# Patient Record
Sex: Female | Born: 1935 | Race: White | Hispanic: No | Smoking: Never smoker
Health system: Southern US, Community
[De-identification: ages and names within clinical notes are randomized; demographics above are authoritative.]

## PROBLEM LIST (undated history)

## (undated) DIAGNOSIS — I639 Cerebral infarction, unspecified: Secondary | ICD-10-CM

## (undated) DIAGNOSIS — E079 Disorder of thyroid, unspecified: Secondary | ICD-10-CM

## (undated) DIAGNOSIS — E785 Hyperlipidemia, unspecified: Secondary | ICD-10-CM

## (undated) HISTORY — DX: Disorder of thyroid, unspecified: E07.9

## (undated) HISTORY — PX: APPENDECTOMY: SHX54

## (undated) HISTORY — DX: Hyperlipidemia, unspecified: E78.5

## (undated) HISTORY — DX: Cerebral infarction, unspecified: I63.9

---

## 2003-10-20 ENCOUNTER — Other Ambulatory Visit: Payer: Self-pay

## 2006-09-05 ENCOUNTER — Ambulatory Visit: Payer: Self-pay | Admitting: Nurse Practitioner

## 2010-05-10 ENCOUNTER — Inpatient Hospital Stay: Payer: Self-pay | Admitting: Internal Medicine

## 2011-05-07 ENCOUNTER — Inpatient Hospital Stay: Payer: Self-pay | Admitting: Internal Medicine

## 2011-05-07 LAB — CK TOTAL AND CKMB (NOT AT ARMC)
CK, Total: 209 U/L (ref 21–215)
CK, Total: 154 U/L (ref 21–215)

## 2011-05-07 LAB — TROPONIN I: Troponin-I: 0.36 ng/mL — ABNORMAL HIGH

## 2011-05-08 LAB — BASIC METABOLIC PANEL
BUN: 17 mg/dL (ref 7–18)
Calcium, Total: 8.1 mg/dL — ABNORMAL LOW (ref 8.5–10.1)
Chloride: 110 mmol/L — ABNORMAL HIGH (ref 98–107)
Osmolality: 294 (ref 275–301)
Potassium: 4.1 mmol/L (ref 3.5–5.1)
Sodium: 147 mmol/L — ABNORMAL HIGH (ref 136–145)

## 2013-09-17 ENCOUNTER — Emergency Department: Payer: Self-pay | Admitting: Emergency Medicine

## 2013-09-17 LAB — URINALYSIS, COMPLETE
BACTERIA: NONE SEEN
Bilirubin,UR: NEGATIVE
Blood: NEGATIVE
Glucose,UR: NEGATIVE mg/dL (ref 0–75)
KETONE: NEGATIVE
LEUKOCYTE ESTERASE: NEGATIVE
Nitrite: NEGATIVE
PH: 5 (ref 4.5–8.0)
Protein: NEGATIVE
RBC,UR: NONE SEEN /HPF (ref 0–5)
SPECIFIC GRAVITY: 1.021 (ref 1.003–1.030)
Squamous Epithelial: 1
WBC UR: NONE SEEN /HPF (ref 0–5)

## 2013-09-17 LAB — COMPREHENSIVE METABOLIC PANEL
ALK PHOS: 136 U/L — AB
Albumin: 3.2 g/dL — ABNORMAL LOW (ref 3.4–5.0)
Anion Gap: 5 — ABNORMAL LOW (ref 7–16)
BUN: 18 mg/dL (ref 7–18)
Bilirubin,Total: 0.4 mg/dL (ref 0.2–1.0)
CHLORIDE: 106 mmol/L (ref 98–107)
CREATININE: 1.05 mg/dL (ref 0.60–1.30)
Calcium, Total: 8.9 mg/dL (ref 8.5–10.1)
Co2: 27 mmol/L (ref 21–32)
GFR CALC AF AMER: 57 — AB
GFR CALC NON AF AMER: 49 — AB
Glucose: 147 mg/dL — ABNORMAL HIGH (ref 65–99)
OSMOLALITY: 280 (ref 275–301)
POTASSIUM: 4.9 mmol/L (ref 3.5–5.1)
SGOT(AST): 71 U/L — ABNORMAL HIGH (ref 15–37)
SGPT (ALT): 81 U/L — ABNORMAL HIGH (ref 12–78)
Sodium: 138 mmol/L (ref 136–145)
TOTAL PROTEIN: 7.6 g/dL (ref 6.4–8.2)

## 2013-09-17 LAB — CBC
HCT: 44.2 % (ref 35.0–47.0)
HGB: 14.6 g/dL (ref 12.0–16.0)
MCH: 30.7 pg (ref 26.0–34.0)
MCHC: 33 g/dL (ref 32.0–36.0)
MCV: 93 fL (ref 80–100)
PLATELETS: 219 10*3/uL (ref 150–440)
RBC: 4.75 10*6/uL (ref 3.80–5.20)
RDW: 13.2 % (ref 11.5–14.5)
WBC: 6.4 10*3/uL (ref 3.6–11.0)

## 2013-09-17 LAB — CK-MB: CK-MB: 2.6 ng/mL (ref 0.5–3.6)

## 2013-09-17 LAB — TROPONIN I: TROPONIN-I: 0.4 ng/mL — AB

## 2013-09-19 LAB — URINE CULTURE

## 2013-11-10 ENCOUNTER — Inpatient Hospital Stay: Payer: Self-pay | Admitting: Internal Medicine

## 2013-11-10 LAB — CK-MB
CK-MB: 2.9 ng/mL (ref 0.5–3.6)
CK-MB: 2.7 ng/mL (ref 0.5–3.6)
CK-MB: 2.5 ng/mL (ref 0.5–3.6)

## 2013-11-10 LAB — CBC
HCT: 46.2 % (ref 35.0–47.0)
HGB: 15 g/dL (ref 12.0–16.0)
MCH: 30.2 pg (ref 26.0–34.0)
MCHC: 32.6 g/dL (ref 32.0–36.0)
MCV: 93 fL (ref 80–100)
Platelet: 196 10*3/uL (ref 150–440)
RBC: 4.98 10*6/uL (ref 3.80–5.20)
RDW: 12.9 % (ref 11.5–14.5)
WBC: 6.9 10*3/uL (ref 3.6–11.0)

## 2013-11-10 LAB — URINALYSIS, COMPLETE
BACTERIA: NONE SEEN
BILIRUBIN, UR: NEGATIVE
Blood: NEGATIVE
Glucose,UR: NEGATIVE mg/dL (ref 0–75)
Ketone: NEGATIVE
LEUKOCYTE ESTERASE: NEGATIVE
NITRITE: NEGATIVE
PROTEIN: NEGATIVE
Ph: 5 (ref 4.5–8.0)
RBC,UR: 2 /HPF (ref 0–5)
Specific Gravity: 1.023 (ref 1.003–1.030)
WBC UR: <1 /HPF (ref 0–5)

## 2013-11-10 LAB — PROTIME-INR
INR: 1
Prothrombin Time: 12.8 secs (ref 11.5–14.7)

## 2013-11-10 LAB — BASIC METABOLIC PANEL
ANION GAP: 6 — AB (ref 7–16)
BUN: 16 mg/dL (ref 7–18)
CO2: 30 mmol/L (ref 21–32)
CREATININE: 0.87 mg/dL (ref 0.60–1.30)
Calcium, Total: 8.5 mg/dL (ref 8.5–10.1)
Chloride: 105 mmol/L (ref 98–107)
Glucose: 154 mg/dL — ABNORMAL HIGH (ref 65–99)
OSMOLALITY: 286 (ref 275–301)
Potassium: 3.7 mmol/L (ref 3.5–5.1)
Sodium: 141 mmol/L (ref 136–145)

## 2013-11-10 LAB — HEMOGLOBIN A1C: Hemoglobin A1C: 6.3 % (ref 4.2–6.3)

## 2013-11-10 LAB — TROPONIN I
TROPONIN-I: 0.47 ng/mL — AB
Troponin-I: 0.49 ng/mL — ABNORMAL HIGH
Troponin-I: 0.43 ng/mL — ABNORMAL HIGH

## 2013-11-10 LAB — HEPARIN LEVEL (UNFRACTIONATED): Anti-Xa(Unfractionated): 0.21 IU/mL — ABNORMAL LOW (ref 0.30–0.70)

## 2013-11-10 LAB — APTT: Activated PTT: 27.4 secs (ref 23.6–35.9)

## 2013-11-10 LAB — TSH: THYROID STIMULATING HORM: 3.7 u[IU]/mL

## 2013-11-11 LAB — COMPREHENSIVE METABOLIC PANEL
ALBUMIN: 3 g/dL — AB (ref 3.4–5.0)
Alkaline Phosphatase: 104 U/L
Anion Gap: 9 (ref 7–16)
BUN: 16 mg/dL (ref 7–18)
Bilirubin,Total: 0.4 mg/dL (ref 0.2–1.0)
CHLORIDE: 104 mmol/L (ref 98–107)
CO2: 27 mmol/L (ref 21–32)
Calcium, Total: 9 mg/dL (ref 8.5–10.1)
Creatinine: 0.82 mg/dL (ref 0.60–1.30)
EGFR (African American): >60
GLUCOSE: 160 mg/dL — AB (ref 65–99)
OSMOLALITY: 284 (ref 275–301)
POTASSIUM: 4 mmol/L (ref 3.5–5.1)
SGOT(AST): 56 U/L — ABNORMAL HIGH (ref 15–37)
SGPT (ALT): 81 U/L — ABNORMAL HIGH (ref 12–78)
Sodium: 140 mmol/L (ref 136–145)
TOTAL PROTEIN: 7.4 g/dL (ref 6.4–8.2)

## 2013-11-11 LAB — HEPARIN LEVEL (UNFRACTIONATED)
ANTI-XA(UNFRACTIONATED): 0.34 [IU]/mL (ref 0.30–0.70)
Anti-Xa(Unfractionated): 0.11 IU/mL — ABNORMAL LOW (ref 0.30–0.70)
Anti-Xa(Unfractionated): 0.29 IU/mL — ABNORMAL LOW (ref 0.30–0.70)

## 2013-11-11 LAB — LIPID PANEL
Cholesterol: 154 mg/dL (ref 0–200)
HDL Cholesterol: 51 mg/dL (ref 40–60)
Ldl Cholesterol, Calc: 81 mg/dL (ref 0–100)
Triglycerides: 110 mg/dL (ref 0–200)
VLDL Cholesterol, Calc: 22 mg/dL (ref 5–40)

## 2013-11-11 LAB — CBC WITH DIFFERENTIAL/PLATELET
Basophil #: 0.1 10*3/uL (ref 0.0–0.1)
Basophil %: 1.1 %
Eosinophil #: 0 10*3/uL (ref 0.0–0.7)
Eosinophil %: 0 %
HCT: 46.1 % (ref 35.0–47.0)
HGB: 15.4 g/dL (ref 12.0–16.0)
LYMPHS PCT: 13.3 %
Lymphocyte #: 1.4 10*3/uL (ref 1.0–3.6)
MCH: 31.2 pg (ref 26.0–34.0)
MCHC: 33.5 g/dL (ref 32.0–36.0)
MCV: 93 fL (ref 80–100)
MONO ABS: 0.3 x10 3/mm (ref 0.2–0.9)
MONOS PCT: 3.1 %
Neutrophil #: 8.8 10*3/uL — ABNORMAL HIGH (ref 1.4–6.5)
Neutrophil %: 82.5 %
Platelet: 229 10*3/uL (ref 150–440)
RBC: 4.96 10*6/uL (ref 3.80–5.20)
RDW: 12.9 % (ref 11.5–14.5)
WBC: 10.7 10*3/uL (ref 3.6–11.0)

## 2013-11-12 LAB — CBC WITH DIFFERENTIAL/PLATELET
Basophil #: 0 10*3/uL (ref 0.0–0.1)
Basophil %: 0.2 %
EOS ABS: 0 10*3/uL (ref 0.0–0.7)
EOS PCT: 0 %
HCT: 41.2 % (ref 35.0–47.0)
HGB: 13.8 g/dL (ref 12.0–16.0)
Lymphocyte #: 1.2 10*3/uL (ref 1.0–3.6)
Lymphocyte %: 8.3 %
MCH: 32.1 pg (ref 26.0–34.0)
MCHC: 33.6 g/dL (ref 32.0–36.0)
MCV: 96 fL (ref 80–100)
Monocyte #: 0.4 x10 3/mm (ref 0.2–0.9)
Monocyte %: 2.8 %
Neutrophil #: 12.3 10*3/uL — ABNORMAL HIGH (ref 1.4–6.5)
Neutrophil %: 88.7 %
Platelet: 229 10*3/uL (ref 150–440)
RBC: 4.31 10*6/uL (ref 3.80–5.20)
RDW: 12.7 % (ref 11.5–14.5)
WBC: 13.9 10*3/uL — ABNORMAL HIGH (ref 3.6–11.0)

## 2013-11-12 LAB — HEPARIN LEVEL (UNFRACTIONATED): Anti-Xa(Unfractionated): 0.41 IU/mL (ref 0.30–0.70)

## 2014-01-04 ENCOUNTER — Ambulatory Visit: Payer: Self-pay | Admitting: Internal Medicine

## 2014-01-30 ENCOUNTER — Inpatient Hospital Stay: Payer: Self-pay | Admitting: Internal Medicine

## 2014-01-30 LAB — COMPREHENSIVE METABOLIC PANEL
ALBUMIN: 3.1 g/dL — AB (ref 3.4–5.0)
Alkaline Phosphatase: 114 U/L
Anion Gap: 7 (ref 7–16)
BUN: 14 mg/dL (ref 7–18)
Bilirubin,Total: 0.8 mg/dL (ref 0.2–1.0)
CALCIUM: 8.4 mg/dL — AB (ref 8.5–10.1)
CHLORIDE: 107 mmol/L (ref 98–107)
CO2: 26 mmol/L (ref 21–32)
CREATININE: 0.86 mg/dL (ref 0.60–1.30)
EGFR (African American): >60
Glucose: 122 mg/dL — ABNORMAL HIGH (ref 65–99)
OSMOLALITY: 281 (ref 275–301)
Potassium: 3.8 mmol/L (ref 3.5–5.1)
SGOT(AST): 42 U/L — ABNORMAL HIGH (ref 15–37)
SGPT (ALT): 77 U/L — ABNORMAL HIGH
SODIUM: 140 mmol/L (ref 136–145)
Total Protein: 7.2 g/dL (ref 6.4–8.2)

## 2014-01-30 LAB — CK TOTAL AND CKMB (NOT AT ARMC)
CK, Total: 80 U/L
CK-MB: 1.7 ng/mL (ref 0.5–3.6)

## 2014-01-30 LAB — CBC
HCT: 44.1 % (ref 35.0–47.0)
HGB: 14.5 g/dL (ref 12.0–16.0)
MCH: 30.9 pg (ref 26.0–34.0)
MCHC: 32.8 g/dL (ref 32.0–36.0)
MCV: 94 fL (ref 80–100)
Platelet: 208 10*3/uL (ref 150–440)
RBC: 4.68 10*6/uL (ref 3.80–5.20)
RDW: 13.6 % (ref 11.5–14.5)
WBC: 12.1 10*3/uL — AB (ref 3.6–11.0)

## 2014-01-30 LAB — HEPARIN LEVEL (UNFRACTIONATED): Anti-Xa(Unfractionated): 0.38 IU/mL (ref 0.30–0.70)

## 2014-01-30 LAB — CK-MB
CK-MB: 1.8 ng/mL (ref 0.5–3.6)
CK-MB: 1.8 ng/mL (ref 0.5–3.6)
CK-MB: 1.6 ng/mL (ref 0.5–3.6)

## 2014-01-30 LAB — TROPONIN I
Troponin-I: 0.31 ng/mL — ABNORMAL HIGH
Troponin-I: 0.31 ng/mL — ABNORMAL HIGH
Troponin-I: 0.21 ng/mL — ABNORMAL HIGH

## 2014-01-30 LAB — APTT: Activated PTT: 31.4 secs (ref 23.6–35.9)

## 2014-01-30 LAB — PROTIME-INR
INR: 1
Prothrombin Time: 12.9 secs (ref 11.5–14.7)

## 2014-01-30 LAB — PRO B NATRIURETIC PEPTIDE: B-Type Natriuretic Peptide: 5228 pg/mL — ABNORMAL HIGH (ref 0–450)

## 2014-01-31 LAB — CBC WITH DIFFERENTIAL/PLATELET
Basophil #: 0.1 10*3/uL (ref 0.0–0.1)
Basophil %: 1 %
Eosinophil #: 0.4 10*3/uL (ref 0.0–0.7)
Eosinophil %: 5.3 %
HCT: 39.1 % (ref 35.0–47.0)
HGB: 13 g/dL (ref 12.0–16.0)
Lymphocyte #: 1.6 10*3/uL (ref 1.0–3.6)
Lymphocyte %: 23.5 %
MCH: 31.6 pg (ref 26.0–34.0)
MCHC: 33.2 g/dL (ref 32.0–36.0)
MCV: 95 fL (ref 80–100)
Monocyte #: 0.9 x10 3/mm (ref 0.2–0.9)
Monocyte %: 13.3 %
Neutrophil #: 3.9 10*3/uL (ref 1.4–6.5)
Neutrophil %: 56.9 %
Platelet: 187 10*3/uL (ref 150–440)
RBC: 4.11 10*6/uL (ref 3.80–5.20)
RDW: 13.6 % (ref 11.5–14.5)
WBC: 6.9 10*3/uL (ref 3.6–11.0)

## 2014-01-31 LAB — HEPARIN LEVEL (UNFRACTIONATED): Anti-Xa(Unfractionated): 0.37 IU/mL (ref 0.30–0.70)

## 2014-01-31 LAB — CREATININE, SERUM
Creatinine: 0.9 mg/dL (ref 0.60–1.30)
EGFR (African American): >60
EGFR (Non-African Amer.): >60

## 2014-01-31 LAB — TSH: Thyroid Stimulating Horm: 4.78 u[IU]/mL — ABNORMAL HIGH

## 2014-02-01 LAB — CBC WITH DIFFERENTIAL/PLATELET
Basophil #: 0 10*3/uL (ref 0.0–0.1)
Basophil %: 0.2 %
Eosinophil #: 0 10*3/uL (ref 0.0–0.7)
Eosinophil %: 0 %
HCT: 41.5 % (ref 35.0–47.0)
HGB: 13.6 g/dL (ref 12.0–16.0)
Lymphocyte #: 0.9 10*3/uL — ABNORMAL LOW (ref 1.0–3.6)
Lymphocyte %: 13.4 %
MCH: 31.2 pg (ref 26.0–34.0)
MCHC: 32.9 g/dL (ref 32.0–36.0)
MCV: 95 fL (ref 80–100)
Monocyte #: 0.2 x10 3/mm (ref 0.2–0.9)
Monocyte %: 2.9 %
Neutrophil #: 5.4 10*3/uL (ref 1.4–6.5)
Neutrophil %: 83.5 %
Platelet: 215 10*3/uL (ref 150–440)
RBC: 4.37 10*6/uL (ref 3.80–5.20)
RDW: 13.4 % (ref 11.5–14.5)
WBC: 6.5 10*3/uL (ref 3.6–11.0)

## 2014-02-01 LAB — BASIC METABOLIC PANEL
Anion Gap: 7 (ref 7–16)
BUN: 23 mg/dL — ABNORMAL HIGH (ref 7–18)
Calcium, Total: 8.5 mg/dL (ref 8.5–10.1)
Chloride: 107 mmol/L (ref 98–107)
Co2: 27 mmol/L (ref 21–32)
Creatinine: 1.03 mg/dL (ref 0.60–1.30)
EGFR (African American): >60
EGFR (Non-African Amer.): 54 — ABNORMAL LOW
Glucose: 161 mg/dL — ABNORMAL HIGH (ref 65–99)
Osmolality: 288 (ref 275–301)
Potassium: 4.1 mmol/L (ref 3.5–5.1)
Sodium: 141 mmol/L (ref 136–145)

## 2014-02-01 LAB — MAGNESIUM: Magnesium: 2.3 mg/dL

## 2014-02-01 LAB — HEPARIN LEVEL (UNFRACTIONATED): Anti-Xa(Unfractionated): 0.41 IU/mL (ref 0.30–0.70)

## 2014-02-02 LAB — BASIC METABOLIC PANEL
Anion Gap: 7 (ref 7–16)
BUN: 34 mg/dL — ABNORMAL HIGH (ref 7–18)
Calcium, Total: 8.3 mg/dL — ABNORMAL LOW (ref 8.5–10.1)
Chloride: 105 mmol/L (ref 98–107)
Co2: 29 mmol/L (ref 21–32)
Creatinine: 1.24 mg/dL (ref 0.60–1.30)
EGFR (African American): 53 — ABNORMAL LOW
EGFR (Non-African Amer.): 44 — ABNORMAL LOW
Glucose: 176 mg/dL — ABNORMAL HIGH (ref 65–99)
Osmolality: 293 (ref 275–301)
Potassium: 3.7 mmol/L (ref 3.5–5.1)
Sodium: 141 mmol/L (ref 136–145)

## 2014-02-02 LAB — CBC WITH DIFFERENTIAL/PLATELET
Basophil #: 0.1 10*3/uL (ref 0.0–0.1)
Basophil %: 0.8 %
Eosinophil #: 0 10*3/uL (ref 0.0–0.7)
Eosinophil %: 0.1 %
HCT: 41.8 % (ref 35.0–47.0)
HGB: 13.7 g/dL (ref 12.0–16.0)
Lymphocyte #: 0.8 10*3/uL — ABNORMAL LOW (ref 1.0–3.6)
Lymphocyte %: 6.6 %
MCH: 30.9 pg (ref 26.0–34.0)
MCHC: 32.7 g/dL (ref 32.0–36.0)
MCV: 94 fL (ref 80–100)
Monocyte #: 0.6 x10 3/mm (ref 0.2–0.9)
Monocyte %: 5 %
Neutrophil #: 10.1 10*3/uL — ABNORMAL HIGH (ref 1.4–6.5)
Neutrophil %: 87.5 %
Platelet: 247 10*3/uL (ref 150–440)
RBC: 4.44 10*6/uL (ref 3.80–5.20)
RDW: 13.1 % (ref 11.5–14.5)
WBC: 11.6 10*3/uL — ABNORMAL HIGH (ref 3.6–11.0)

## 2014-02-02 LAB — PROTIME-INR
INR: 1
PROTHROMBIN TIME: 13.5 s (ref 11.5–14.7)

## 2014-02-02 LAB — HEPARIN LEVEL (UNFRACTIONATED): Anti-Xa(Unfractionated): 0.5 IU/mL (ref 0.30–0.70)

## 2014-02-02 LAB — APTT: ACTIVATED PTT: 49.6 s — AB (ref 23.6–35.9)

## 2014-02-03 ENCOUNTER — Ambulatory Visit: Payer: Self-pay | Admitting: Internal Medicine

## 2014-02-03 LAB — CBC WITH DIFFERENTIAL/PLATELET
Basophil #: 0 10*3/uL (ref 0.0–0.1)
Basophil %: 0.1 %
Eosinophil #: 0 10*3/uL (ref 0.0–0.7)
Eosinophil %: 0.1 %
HCT: 42.4 % (ref 35.0–47.0)
HGB: 14 g/dL (ref 12.0–16.0)
Lymphocyte #: 0.5 10*3/uL — ABNORMAL LOW (ref 1.0–3.6)
Lymphocyte %: 4.3 %
MCH: 31.1 pg (ref 26.0–34.0)
MCHC: 33 g/dL (ref 32.0–36.0)
MCV: 94 fL (ref 80–100)
Monocyte #: 0.6 x10 3/mm (ref 0.2–0.9)
Monocyte %: 5.4 %
Neutrophil #: 10.8 10*3/uL — ABNORMAL HIGH (ref 1.4–6.5)
Neutrophil %: 90.1 %
Platelet: 232 10*3/uL (ref 150–440)
RBC: 4.5 10*6/uL (ref 3.80–5.20)
RDW: 13.2 % (ref 11.5–14.5)
WBC: 12 10*3/uL — ABNORMAL HIGH (ref 3.6–11.0)

## 2014-02-03 LAB — BASIC METABOLIC PANEL
Anion Gap: 6 — ABNORMAL LOW (ref 7–16)
BUN: 41 mg/dL — ABNORMAL HIGH (ref 7–18)
Calcium, Total: 8.2 mg/dL — ABNORMAL LOW (ref 8.5–10.1)
Chloride: 107 mmol/L (ref 98–107)
Co2: 28 mmol/L (ref 21–32)
Creatinine: 1.24 mg/dL (ref 0.60–1.30)
EGFR (African American): 53 — ABNORMAL LOW
EGFR (Non-African Amer.): 44 — ABNORMAL LOW
Glucose: 160 mg/dL — ABNORMAL HIGH (ref 65–99)
Osmolality: 295 (ref 275–301)
Potassium: 3.7 mmol/L (ref 3.5–5.1)
Sodium: 141 mmol/L (ref 136–145)

## 2014-02-03 LAB — MAGNESIUM: Magnesium: 2.3 mg/dL

## 2014-02-03 LAB — HEPARIN LEVEL (UNFRACTIONATED): Anti-Xa(Unfractionated): 0.59 IU/mL (ref 0.30–0.70)

## 2014-02-04 LAB — CBC WITH DIFFERENTIAL/PLATELET
Basophil #: 0 10*3/uL (ref 0.0–0.1)
Basophil %: 0.2 %
Eosinophil #: 0 10*3/uL (ref 0.0–0.7)
Eosinophil %: 0.1 %
HCT: 43.3 % (ref 35.0–47.0)
HGB: 13.8 g/dL (ref 12.0–16.0)
Lymphocyte #: 0.5 10*3/uL — ABNORMAL LOW (ref 1.0–3.6)
Lymphocyte %: 5.3 %
MCH: 30.3 pg (ref 26.0–34.0)
MCHC: 31.8 g/dL — ABNORMAL LOW (ref 32.0–36.0)
MCV: 95 fL (ref 80–100)
Monocyte #: 0.5 x10 3/mm (ref 0.2–0.9)
Monocyte %: 4.8 %
Neutrophil #: 8.6 10*3/uL — ABNORMAL HIGH (ref 1.4–6.5)
Neutrophil %: 89.6 %
Platelet: 230 10*3/uL (ref 150–440)
RBC: 4.55 10*6/uL (ref 3.80–5.20)
RDW: 13.3 % (ref 11.5–14.5)
WBC: 9.6 10*3/uL (ref 3.6–11.0)

## 2014-02-04 LAB — CULTURE, BLOOD (SINGLE)

## 2014-02-04 LAB — HEPARIN LEVEL (UNFRACTIONATED)
Anti-Xa(Unfractionated): 0.95 IU/mL — ABNORMAL HIGH (ref 0.30–0.70)
Anti-Xa(Unfractionated): 0.66 IU/mL (ref 0.30–0.70)

## 2014-02-05 LAB — CBC WITH DIFFERENTIAL/PLATELET
Basophil #: 0 10*3/uL (ref 0.0–0.1)
Basophil %: 0.2 %
Eosinophil #: 0 10*3/uL (ref 0.0–0.7)
Eosinophil %: 0 %
HCT: 42.3 % (ref 35.0–47.0)
HGB: 13.9 g/dL (ref 12.0–16.0)
Lymphocyte #: 0.5 10*3/uL — ABNORMAL LOW (ref 1.0–3.6)
Lymphocyte %: 3.9 %
MCH: 31 pg (ref 26.0–34.0)
MCHC: 32.9 g/dL (ref 32.0–36.0)
MCV: 94 fL (ref 80–100)
Monocyte #: 1.1 x10 3/mm — ABNORMAL HIGH (ref 0.2–0.9)
Monocyte %: 8.5 %
Neutrophil #: 11.5 10*3/uL — ABNORMAL HIGH (ref 1.4–6.5)
Neutrophil %: 87.4 %
Platelet: 239 10*3/uL (ref 150–440)
RBC: 4.49 10*6/uL (ref 3.80–5.20)
RDW: 13.4 % (ref 11.5–14.5)
WBC: 13.1 10*3/uL — ABNORMAL HIGH (ref 3.6–11.0)

## 2014-02-05 LAB — HEPARIN LEVEL (UNFRACTIONATED): Anti-Xa(Unfractionated): 0.57 IU/mL (ref 0.30–0.70)

## 2014-02-06 LAB — CBC WITH DIFFERENTIAL/PLATELET
Bands: 2 %
Comment - H1-Com3: NORMAL
HCT: 41.4 % (ref 35.0–47.0)
HGB: 13.5 g/dL (ref 12.0–16.0)
Lymphocytes: 4 %
MCH: 30.8 pg (ref 26.0–34.0)
MCHC: 32.7 g/dL (ref 32.0–36.0)
MCV: 94 fL (ref 80–100)
Metamyelocyte: 3 %
Monocytes: 5 %
Platelet: 240 10*3/uL (ref 150–440)
RBC: 4.39 10*6/uL (ref 3.80–5.20)
RDW: 13.2 % (ref 11.5–14.5)
Segmented Neutrophils: 86 %
WBC: 12.2 10*3/uL — ABNORMAL HIGH (ref 3.6–11.0)

## 2014-02-06 LAB — HEPARIN LEVEL (UNFRACTIONATED): Anti-Xa(Unfractionated): 0.65 IU/mL (ref 0.30–0.70)

## 2014-02-07 LAB — BASIC METABOLIC PANEL
Anion Gap: 5 — ABNORMAL LOW (ref 7–16)
BUN: 55 mg/dL — ABNORMAL HIGH (ref 7–18)
Calcium, Total: 7.8 mg/dL — ABNORMAL LOW (ref 8.5–10.1)
Chloride: 101 mmol/L (ref 98–107)
Co2: 35 mmol/L — ABNORMAL HIGH (ref 21–32)
Creatinine: 1.44 mg/dL — ABNORMAL HIGH (ref 0.60–1.30)
EGFR (African American): 45 — ABNORMAL LOW
EGFR (Non-African Amer.): 37 — ABNORMAL LOW
Glucose: 196 mg/dL — ABNORMAL HIGH (ref 65–99)
Osmolality: 302 (ref 275–301)
Potassium: 3.8 mmol/L (ref 3.5–5.1)
Sodium: 141 mmol/L (ref 136–145)

## 2014-02-07 LAB — CBC WITH DIFFERENTIAL/PLATELET
Basophil #: 0 10*3/uL (ref 0.0–0.1)
Basophil %: 0.1 %
Eosinophil #: 0 10*3/uL (ref 0.0–0.7)
Eosinophil %: 0 %
HCT: 35.6 % (ref 35.0–47.0)
HGB: 11.3 g/dL — ABNORMAL LOW (ref 12.0–16.0)
Lymphocyte #: 0.8 10*3/uL — ABNORMAL LOW (ref 1.0–3.6)
Lymphocyte %: 4 %
MCH: 30.2 pg (ref 26.0–34.0)
MCHC: 31.8 g/dL — ABNORMAL LOW (ref 32.0–36.0)
MCV: 95 fL (ref 80–100)
Monocyte #: 1 x10 3/mm — ABNORMAL HIGH (ref 0.2–0.9)
Monocyte %: 5 %
Neutrophil #: 17.7 10*3/uL — ABNORMAL HIGH (ref 1.4–6.5)
Neutrophil %: 90.9 %
Platelet: 237 10*3/uL (ref 150–440)
RBC: 3.75 10*6/uL — ABNORMAL LOW (ref 3.80–5.20)
RDW: 13.1 % (ref 11.5–14.5)
WBC: 19.4 10*3/uL — ABNORMAL HIGH (ref 3.6–11.0)

## 2014-02-09 LAB — BASIC METABOLIC PANEL
Anion Gap: 7 (ref 7–16)
BUN: 57 mg/dL — ABNORMAL HIGH (ref 7–18)
Calcium, Total: 7.5 mg/dL — ABNORMAL LOW (ref 8.5–10.1)
Chloride: 100 mmol/L (ref 98–107)
Co2: 31 mmol/L (ref 21–32)
Creatinine: 1.36 mg/dL — ABNORMAL HIGH (ref 0.60–1.30)
EGFR (African American): 48 — ABNORMAL LOW
GFR CALC NON AF AMER: 39 — AB
Glucose: 204 mg/dL — ABNORMAL HIGH (ref 65–99)
Osmolality: 297 (ref 275–301)
POTASSIUM: 3.7 mmol/L (ref 3.5–5.1)
Sodium: 138 mmol/L (ref 136–145)

## 2014-02-09 LAB — CBC WITH DIFFERENTIAL/PLATELET
BASOS ABS: 0 10*3/uL (ref 0.0–0.1)
BASOS PCT: 0.1 %
EOS ABS: 0 10*3/uL (ref 0.0–0.7)
Eosinophil %: 0 %
HCT: 34.4 % — ABNORMAL LOW (ref 35.0–47.0)
HGB: 11.3 g/dL — ABNORMAL LOW (ref 12.0–16.0)
Lymphocyte #: 0.8 10*3/uL — ABNORMAL LOW (ref 1.0–3.6)
Lymphocyte %: 4.3 %
MCH: 30.6 pg (ref 26.0–34.0)
MCHC: 32.9 g/dL (ref 32.0–36.0)
MCV: 93 fL (ref 80–100)
Monocyte #: 0.7 x10 3/mm (ref 0.2–0.9)
Monocyte %: 3.8 %
NEUTROS ABS: 16.6 10*3/uL — AB (ref 1.4–6.5)
NEUTROS PCT: 91.8 %
Platelet: 241 10*3/uL (ref 150–440)
RBC: 3.7 10*6/uL — ABNORMAL LOW (ref 3.80–5.20)
RDW: 12.8 % (ref 11.5–14.5)
WBC: 18.1 10*3/uL — ABNORMAL HIGH (ref 3.6–11.0)

## 2014-02-09 LAB — OCCULT BLOOD X 1 CARD TO LAB, STOOL: Occult Blood, Feces: POSITIVE

## 2014-02-09 LAB — HEMOGLOBIN A1C: Hemoglobin A1C: 6.9 % — ABNORMAL HIGH (ref 4.2–6.3)

## 2014-02-11 LAB — CBC WITH DIFFERENTIAL/PLATELET
BANDS NEUTROPHIL: 1 %
HCT: 30.8 % — ABNORMAL LOW (ref 35.0–47.0)
HGB: 10.2 g/dL — AB (ref 12.0–16.0)
Lymphocytes: 7 %
MCH: 31.1 pg (ref 26.0–34.0)
MCHC: 33.1 g/dL (ref 32.0–36.0)
MCV: 94 fL (ref 80–100)
MYELOCYTE: 1 %
Metamyelocyte: 4 %
Monocytes: 3 %
NRBC/100 WBC: 1 /
PLATELETS: 242 10*3/uL (ref 150–440)
RBC: 3.27 10*6/uL — AB (ref 3.80–5.20)
RDW: 13.1 % (ref 11.5–14.5)
SEGMENTED NEUTROPHILS: 84 %
WBC: 20.1 10*3/uL — ABNORMAL HIGH (ref 3.6–11.0)

## 2014-02-12 LAB — CBC WITH DIFFERENTIAL/PLATELET
BASOS ABS: 0 10*3/uL (ref 0.0–0.1)
Basophil %: 0.1 %
Eosinophil #: 0 10*3/uL (ref 0.0–0.7)
Eosinophil %: 0 %
HCT: 31.1 % — ABNORMAL LOW (ref 35.0–47.0)
HGB: 10.1 g/dL — ABNORMAL LOW (ref 12.0–16.0)
LYMPHS ABS: 0.6 10*3/uL — AB (ref 1.0–3.6)
Lymphocyte %: 2.4 %
MCH: 30.8 pg (ref 26.0–34.0)
MCHC: 32.4 g/dL (ref 32.0–36.0)
MCV: 95 fL (ref 80–100)
MONO ABS: 1.5 x10 3/mm — AB (ref 0.2–0.9)
Monocyte %: 5.8 %
NEUTROS PCT: 91.7 %
Neutrophil #: 23.2 10*3/uL — ABNORMAL HIGH (ref 1.4–6.5)
Platelet: 244 10*3/uL (ref 150–440)
RBC: 3.27 10*6/uL — ABNORMAL LOW (ref 3.80–5.20)
RDW: 13 % (ref 11.5–14.5)
WBC: 25.3 10*3/uL — AB (ref 3.6–11.0)

## 2014-02-14 LAB — CBC WITH DIFFERENTIAL/PLATELET
BASOS PCT: 0.3 %
Basophil #: 0.1 10*3/uL (ref 0.0–0.1)
EOS ABS: 0 10*3/uL (ref 0.0–0.7)
Eosinophil %: 0 %
HCT: 34.1 % — ABNORMAL LOW (ref 35.0–47.0)
HGB: 11.1 g/dL — ABNORMAL LOW (ref 12.0–16.0)
LYMPHS ABS: 0.6 10*3/uL — AB (ref 1.0–3.6)
LYMPHS PCT: 2.3 %
MCH: 30.7 pg (ref 26.0–34.0)
MCHC: 32.4 g/dL (ref 32.0–36.0)
MCV: 95 fL (ref 80–100)
Monocyte #: 0.8 x10 3/mm (ref 0.2–0.9)
Monocyte %: 3.2 %
Neutrophil #: 22.4 10*3/uL — ABNORMAL HIGH (ref 1.4–6.5)
Neutrophil %: 94.2 %
Platelet: 274 10*3/uL (ref 150–440)
RBC: 3.6 10*6/uL — ABNORMAL LOW (ref 3.80–5.20)
RDW: 13.7 % (ref 11.5–14.5)
WBC: 23.8 10*3/uL — AB (ref 3.6–11.0)

## 2014-02-14 LAB — BASIC METABOLIC PANEL
Anion Gap: 7 (ref 7–16)
BUN: 37 mg/dL — ABNORMAL HIGH (ref 7–18)
CALCIUM: 8 mg/dL — AB (ref 8.5–10.1)
Chloride: 102 mmol/L (ref 98–107)
Co2: 32 mmol/L (ref 21–32)
Creatinine: 1.2 mg/dL (ref 0.60–1.30)
EGFR (Non-African Amer.): 46 — ABNORMAL LOW
GFR CALC AF AMER: 55 — AB
GLUCOSE: 222 mg/dL — AB (ref 65–99)
Osmolality: 297 (ref 275–301)
POTASSIUM: 4.6 mmol/L (ref 3.5–5.1)
Sodium: 141 mmol/L (ref 136–145)

## 2014-03-06 ENCOUNTER — Ambulatory Visit: Payer: Self-pay | Admitting: Internal Medicine

## 2014-08-27 NOTE — Consult Note (Signed)
   Comments   I received a call from Marletta Lorracy McKinney and Koleen NimrodAdrian Day, both of whom are pt's DSS guardians. They would like to change patient's code status to DNR. Orders entered.   Electronic Signatures: Borders, Daryl EasternJoshua R (NP)  (Signed 30-Sep-15 14:58)  Authored: Palliative Care Phifer, Harriett SineNancy (MD)  (Signed 30-Sep-15 16:50)  Authored: Palliative Care   Last Updated: 30-Sep-15 16:50 by Phifer, Harriett SineNancy (MD)

## 2014-08-27 NOTE — Consult Note (Signed)
PATIENT NAME:  Lori Elliott, Lori MR#:  [id number] DATE OF BIRTH:  Dec 06, 1928  DATE OF CONSULTATION:  01/30/2014  REFERRING PHYSICIAN:   CONSULTING PHYSICIAN:  Lamar BlinksBruce J. Salomon Ganser, MD  CONSULTING PHYSICIAN:  Dr. Sheryle Hailiamond.    REASON FOR CONSULTATION:  Nonrheumatic paroxysmal atrial fibrillation, acute diastolic dysfunction heart failure, mixed hyperlipidemia and essential hypertension.   CHIEF COMPLAINT: The patient is obtunded and unable to give review of systems or treat.    HISTORY OF PRESENT ILLNESS: This is an 81 year old female in assisted living, who has had a significant progression of shortness of breath, weakness, fatigue, wheezing, and hypoxia with an irregular heartbeat and was brought to the Emergency Room under those specific conditions having hypoxia and needing BiPAP machine with improvements with BiPAP and oxygen. The patient has had some respiratory issues and concerns for bronchitis with a chest x-ray consistent with this as well at some pulmonary edema. The patient also has had elevated troponin of 0.31 consistent with demand ischemia rather than acute coronary syndrome. The patient has had new onset of nonrheumatic paroxysmal atrial fibrillation with rapid ventricular rate with hypotension, which is now under better control with metoprolol, and also has a BNP of 5228 consistent with acute diastolic dysfunction congestive heart failure due to rapid ventricular rate of atrial fibrillation. Previously the patient has been on appropriate medication for hypertension control and mixed hyperlipidemia. Has been not treated with statin therapy due to no evidence of previous coronary artery disease or vascular disease. The patient currently is more stable and feeling better. EKG has shown atrial fibrillation with rapid ventricular rate and now telemetry does show normal sinus rhythm with pre-atrial contractions.    REMAINDER OF REVIEW OF SYSTEMS: Cannot be assessed due to the patient's obtundation.    PAST MEDICAL HISTORY:   1.  Essential hypertension 2.  Mixed hyperlipidemia.   FAMILY HISTORY: No family members with early onset of cardiovascular disease or hypertension.   SOCIAL HISTORY:  Currently denies alcohol or tobacco use.   ALLERGIES: AS LISTED.   MEDICATIONS: As listed.   PHYSICAL EXAMINATION:  VITAL SIGNS: Blood pressure is 100/60 bilaterally, heart rate is 72 and irregular.  GENERAL: She is an ill-appearing, elderly female, disheveled.  HEAD, EYES, EARS, NOSE, AND THROAT: No icterus, thyromegaly, ulcers, hemorrhage, or xanthelasma.  CARDIOVASCULAR: Irregularly irregular with normal S1 and S2. No apparent murmur, gallop, or rub. PMI is diffuse. Carotid upstroke normal with no bruits. Jugular venous pressure is normal.  LUNGS: Have expiratory wheezes with rhonchi and few basilar rales.  ABDOMEN: Soft, nontender. Cannot assess hepatosplenomegaly or masses due to increased abdominal girth.  EXTREMITIES: 2 + radial, trace femoral and dorsal pedal pulses, with 1 + lower extremity edema. No cyanosis, clubbing, or ulcers.  NEUROLOGIC: She is not oriented to time, place, and person, but no focal deficits.   ASSESSMENT: An 81 year old female with nonrheumatic paroxysmal new onset atrial fibrillation with rapid ventricular rate, elevated troponin consistent with demand ischemia due to hypoxia and current illness, essential hypertension, mixed hyperlipidemia, and acute diastolic dysfunction congestive heart failure.   RECOMMENDATIONS:  1.  Intravenous Lasix 1 dose if necessary for pulmonary edema and diastolic dysfunction congestive heart failure.  2.  Echocardiogram for LV systolic dysfunction and causes of above.  3.  Beta blocker for heart rate control and maintenance of normal sinus rhythm as well as for diastolic dysfunction heart failure.  4.  Anticoagulation with heparin for reduction of deep venous thrombosis as well as risk reduction in stroke with  atrial fibrillation.  5.   Consider long-term anticoagulation depending on acuity of atrial fibrillation.  6.  Treatment of hypoxia and acute on chronic obstructive pulmonary disease.  7.  Essential hypertension control with beta blocker.   8.  No additional treatment of mixed hyperlipidemia at this time.     ____________________________ Lamar Blinks, MD bjk:bu D: 01/30/2014 09:55:41 ET T: 01/30/2014 13:11:38 ET JOB#: [id number]  cc: Lamar Blinks, MD, <Dictator> Lamar Blinks MD ELECTRONICALLY SIGNED 02/02/2014 15:48

## 2014-08-27 NOTE — Discharge Summary (Signed)
PATIENT NAME:  Lori Elliott, Lori Elliott MR#:  [id number] DATE OF BIRTH:  1928-10-15  DATE OF ADMISSION:  01/30/2014 DATE OF DISCHARGE:  02/14/2014  ADMITTING PHYSICIAN: Joycelyn Rua, MD  DISCHARGING PHYSICIAN: Enid Baas M.D.   PRIMARY CARE PHYSICIAN: Darreld Mclean, M.D.   CONSULTATIONS IN HOSPITAL:  1.  Cardiology with Dr. Arnoldo Hooker.  2.  Palliative care by Dr. Harriett Sine Phifer.   DISCHARGE DIAGNOSES:  1.  Sepsis.  2.  Pneumonia.  3.  Chronic obstructive pulmonary disease exacerbation.  4.  Hypothyroidism.  5.  Atrial fibrillation with tachybrady syndrome.  6.  Large subcutaneous right flank hematoma while on Xarelto.  7.  Dementia.  8.  Hypertension.   DISCHARGE MEDICATIONS: 1.  Synthroid 25 mcg p.o. daily.  2.  Multivitamin 1 tablet p.o. daily.  3.  Lovastatin 40 mg p.o. daily.  4.  Travatan 0.004% ophthalmic solution 1 drop both eyes at bedtime.  5.  Albuterol inhaler 2 puffs 4 times a day as needed for shortness of breath or wheezing.  6.  Metoprolol 12.5 mg p.o. b.i.d.  7.  Prednisone taper over 5 days.  8.  Amiodarone 400 mg p.o. b.i.d.  9.  Fluoxetine 20 mg p.o. daily.  10.  Xanax 0.25 mg p.o. q. 8 hours p.r.n. for anxiety.  11.  Lasix 20 mg p.o. b.i.d.  12.  Colace 100 mg p.o. b.i.d.  13.  Levaquin 750 mg p.o. q. 48 hours for 4 more days.  14.  Klor-Con 20 mEq p.o. daily while on Lasix.  15.  Tylenol 650 mg q. 6 hours p.r.n. for pain.   DISCHARGE HOME OXYGEN: 2 liters.   DISCHARGE DIET: Mechanical soft with pureed meats and Glucerna 3 times a day.   DISCHARGE ACTIVITY: As tolerated.  FOLLOWUP INSTRUCTIONS: 1.  Follow up with Dr. Gwen Pounds days in 4 to 5 days after discharge.  2.  PCP follow-up in 1 week.  3.  Will need repeat CT of abdomen and pelvis without any contrast to follow up the right flank hematoma in 2 weeks. Also advised to avoid any anticoagulants during this period.  4.  Encouraged to do incentive spirometry and ice pack p.r.n. for the right  flank region as needed.  5.  Resume hospice services.   DIAGNOSTIC DATA: Lab data prior to discharge: WBC 23.8, hemoglobin 11.2, hematocrit 34.1, and platelet count 74,000.   Sodium 141, potassium 4.6, chloride 102, bicarb 32, BUN 37, creatinine 1.2, glucose 222, and calcium 8.  CT of the abdomen and pelvis without contrast on February 13, 2014 showing right flank hematoma measuring 14.8 x 18.5 cm, also small intramuscular component just above the right iliac bone noted, which also hematoma. Otherwise, no acute abnormality. Colonic diverticula noted without any evidence of acute diverticulitis, also possible fecal impaction.   Chest x-ray showing right middle lobe atelectasis and linear atelectasis in the lingula. No new abnormalities noted.  X-ray of the hip showing no evidence of any acute fractures.   BRIEF HOSPITAL COURSE: For more details, look at the history and physical dictated by Dr. Sheryle Hail on January 30, 2014 and also the interim discharge summary dictated by Dr. Luberta Mutter on February 11, 2014.   In brief, Lori Elliott is an 81 year old elderly Caucasian female with past medical history significant for dementia, congestive heart failure and atrial fibrillation who was brought from [facility] assisted living facility secondary to lethargy and respiratory distress. The patient was noted to be septic and had pneumonia and COPD exacerbation on admission.  1.  Sepsis secondary to pneumonia. Chest x-ray with right-sided infiltrate. Initially in the ICU, currently weaned off to 2 liters oxygen. She was on BiPAP at the time of admission. Repeat chest x-ray showing worsening atelectasis. The patient has been bed bound most of her time here. She was encouraged to do incentive spirometry. She was on 2.5 liters oxygen for the last few days and is weaned down to 2 liters at this time. She is still on antibiotics, which she will finish off in the next 4 days. She will be discharged on Levaquin and her  IV steroids are being changed over to oral prednisone taper and inhalers will be continued. Her sepsis has resolved at this time.  2.  Atrial fibrillation with tachybrady syndrome. Followed by Dr. Gwen PoundsKowalski while in the hospital. Rate was uncontrollable. She was placed on amiodarone drip and moved to CCU. However, she became bradycardic with long pauses so amiodarone changed over to p.o. and beta blockers were held at the time. Now her heart rate is again in the 100 range. Discussed with Dr. Gwen PoundsKowalski who recommended only 12.5 mg of metoprolol twice a day at this time and outpatient follow-up. She is not a candidate for anticoagulation secondary to having a large right flank hematoma.  3.  Right flank subcutaneous hematomas since Xarelto was started. It is about 14.8 x 18.5 cm extending into the right flank. Repeat CT showed persistent hematoma. No active bleeding. Hemoglobin has been stable. Discussion with surgery recommended only conservative management, so ice pack p.r.n. and repeat CT in 2 weeks. Xarelto and other anticoagulants are stopped at this time. Continue to monitor. Pain medications as needed to encourage ambulation.  4.  Depression. Fluoxetine was started while in the hospital, which she will continue.  5.  Hypothyroidism. On Synthroid.  6.  Dementia. Pleasantly confused and at baseline at this time. Overall poor prognosis. Seen by palliative care in the hospital: The patient is a DNR. She does not have any family involved in her care. She is a ward of the state. She will be followed by hospice services at discharge. Physical therapy recommended skilled nursing facility.  DISCHARGE CONDITION: Guarded.   DISCHARGE DISPOSITION: To skilled nursing facility.   CODE STATUS: DO NOT RESUSCITATE   TIME SPENT ON DISCHARGE: 45 minutes.   ____________________________ Enid Baasadhika Yahya Boldman, MD rk:sb D: 02/14/2014 10:50:01 ET T: 02/14/2014 11:22:52 ET JOB#: [id number]  cc: Enid Baasadhika Eean Buss, MD,  <Dictator> Enid BaasADHIKA Sheral Pfahler MD ELECTRONICALLY SIGNED 02/16/2014 18:06

## 2014-08-27 NOTE — Discharge Summary (Signed)
PATIENT NAME:  Lori Elliott, Lori Elliott DATE OF BIRTH:  02-Mar-1929  DATE OF ADMISSION:  11/10/2013 DATE OF DISCHARGE:  11/12/2013  PRIMARY CARE PHYSICIAN: Dr. Marvis MoellerMiles.   CONSULTATION: Dr. Juliann Paresallwood.  DISCHARGE DIAGNOSES: 1.  Chronic obstructive pulmonary disease exacerbation with acute bronchitis.  2.  Elevated troponin. 3.  New paroxysmal atrial fibrillation. 4.  Dementia.   CONDITION: Stable.   CODE STATUS: FULL CODE.   HOME MEDICATIONS: Please refer to the medication list. The patient needs home health and physical therapy.   DIET: Low-sodium, low-fat, low-cholesterol diet.   ACTIVITY: As tolerated.   FOLLOW-UP CARE: Follow up with PCP within 1-2 weeks. Follow up with Dr. Gwen PoundsKowalski within 1-2 weeks.   REASON FOR ADMISSION: Cough and chest congestion for 2 weeks.   HOSPITAL COURSE: [name] with a history of hypertension, elevated troponin, and dementia was sent from assisted living due to cough and chest congestion for 2 weeks. The patient's oxygen saturation was 90%- 93% per EMS. The patient had a cough with yellowish phlegm, but denied any significant chest pain. The patient's troponin level was 0.49. For detailed history and physical examination, please refer to the admission note dictated by Dr. Winona LegatoVaickute.  Laboratory data on admission date showed glucose 154, otherwise, BMP was unremarkable. Troponin was 0.49. CBC was in normal range. EKG showed normal sinus rhythm with sinus arrhythmia at 78 BPM. Elevated troponin, which is possibly due to demand ischemia.  After admission, the patient has been treated with aspirin, nitroglycerin, and Lopressor.   Chronic obstructive pulmonary disease exacerbation at admission.  The patient has been treated with IV Solu-Medrol, nebulizer and oxygen by nasal cannula. The patient's shortness of breath is much improved. She is off oxygen with normal oxygen saturation.   New onset paroxysmal atrial  fibrillation. The patient was noted to have paroxysmal atrial fibrillation after admission.  The patient's atrial fibrillation converted to normal sinus rhythm. The patient was on heparin drip and Dr. Juliann Paresallwood suggested continue medical treatment and no further cardiac work-up. The patient has the atrial fibrillation.   Since the patient has dementia and high risk of a fall. The patient is not a candidate for anticoagulation.  The patient will be given aspirin 325 mg daily. According to physical therapy evaluation, the patient needs home health and physical therapy. The patient has only mild cough. Denies any shortness of breath. Vital signs are stable. She is clinically stable and will be discharged to  assisted living facility with home health and PT today. I discussed patient's discharge plan with the nurse, case manager, social worker and Dr. Juliann Paresallwood.   TIME SPENT: About 39 minutes.    ____________________________ Shaune PollackQing Maryruth Apple, MD qc:ts D: 11/12/2013 13:58:59 ET T: 11/12/2013 16:13:35 ET JOB#: [id number]  cc: Shaune PollackQing Quinnetta Roepke, MD, <Dictator> Shaune PollackQING Margrette Wynia MD ELECTRONICALLY SIGNED 11/13/2013 16:08

## 2014-08-27 NOTE — H&P (Signed)
PATIENT NAME:  Lori Elliott, Lori Elliott MR#:  [id number] DATE OF BIRTH:  07-May-1928  DATE OF ADMISSION:  01/30/2014  REFERRING PHYSICIAN: Darien Ramus, MD  PRIMARY CARE DOCTOR: Leanna Sato, MD  ADMITTING DIAGNOSES: Pneumonia and respiratory failure.   HISTORY OF PRESENT ILLNESS: This is an 81 year old Caucasian female who presents from her assisted living facility with respiratory distress. The time that I arrived in the Emergency Department the patient was on BiPAP and was relatively somnolent but arousable. I do not have the details of the timing, severity or instigating circumstances regarding the patient's respiratory distress. She was found to be very tachycardic in the Emergency Department with a rhythm that appeared to be supraventricular tachycardia. Also, a chest x-ray showed a developing infiltrate in the patient's left lower lobe which prompted the Emergency Department to call for admission.   REVIEW OF SYSTEMS: Not completely obtainable as the patient is somewhat somnolent and/or fatigued, but she nods her head in denial of chest pain, nausea or pain anywhere in her body. She also denies palpitations.   PAST MEDICAL HISTORY: Taken from previous documentation, hypertension, hypothyroidism and dementia.   PAST SURGICAL HISTORY: Not available at this time.   FAMILY HISTORY: Per the patient's chart, strokes and glaucoma as well as heart disease.   SOCIAL HISTORY: The patient is a resident of [facility]. She is widowed. She is a nonsmoker and does not drink alcohol or take any illicit drugs.   HOME MEDICATIONS:  1.  Amlodipine 5 mg 1 tablet p.o. daily.  2.  Albuterol 90 mcg inhaler 2 puffs 4 times a day as needed for shortness of breath.  3.  Aspirin 325 mg 1 tablet p.o. daily.  4.  Multivitamin 1 tablet p.o. daily.  5.  Levothyroxine 25 mcg 1 tablet p.o. daily.  6.  Lovastatin 40 mg 1 tablet p.o. daily.  7.  Metoprolol 25 mg 1 tablet p.o. b.i.d.  8.  Prednisone taper which may  or may not be completed at this time.  9.  Travatan 0.004% ophthalmic solution 1 drop to both eyes daily before bed.   ALLERGIES: PENICILLIN.   PERTINENT LABORATORY RESULTS AND RADIOGRAPHIC FINDINGS: Serum glucose is 122. BNP is 5228. BUN and creatinine are normal. Sodium, potassium, chloride and bicarbonate by basic metabolic panel are also normal. Calcium is 8.4. Serum albumin is 3.1, alkaline phosphatase 114, AST 42, ALT 77. Initial troponin is 0.31. White blood cell count is 12.1, hemoglobin 14.5, hematocrit 44.1. INR is 1. Chest x-ray shows a developing focal consolidation in the left midlung, suggesting pneumonia. There is some cardiac enlargement as well as some basilar atelectasis.   PHYSICAL EXAMINATION:  VITAL SIGNS: Temperature 98.5, pulse 145, respirations 31, blood pressure 111/84, pulse oximetry 97% on O2 by BiPAP.  GENERAL: The patient is drowsy but oriented to person. She is clearly in respiratory distress but denies pain.  HEENT: Normocephalic, atraumatic. Pupils are equal, round and reactive to light and accommodation. Extraocular movements are intact. Oropharynx is not visualized due to BiPAP mask being in place.  NECK: Trachea is midline. No adenopathy.  CHEST: Symmetric and atraumatic.  CARDIOVASCULAR: Tachycardic rhythm. Normal S1, S2. No rubs, clicks or murmurs appreciated.  LUNGS: Clear to auscultation bilaterally with no use of accessory muscles, but, again, BiPAP is in place and the patient is tachypneic.  ABDOMEN: Positive bowel sounds. Soft, nontender, nondistended. No hepatosplenomegaly.  GENITOURINARY: Deferred.  MUSCULOSKELETAL: The patient clearly moves all 4 extremities equally but does not participate in strength  testing.  SKIN: No rashes. There are multiple moles and skin tags, particularly over the patient's neck and chest.  EXTREMITIES: No clubbing or cyanosis, but the patient does have 1+ pitting edema of both lower extremities to the knees.  NEUROLOGIC: The  patient appears to have normal cranial nerves II through XII, but, again, some of the exam is not precise as the patient has her BiPAP mask in place.  PSYCHIATRIC: Not able to assess as the patient is in respiratory distress at this time.   ASSESSMENT AND PLAN: This is an 81 year old female with pneumonia, respiratory failure, and elevated troponin.  1.  Healthcare-associated pneumonia. Chest x-ray shows a developing left lower lobe infiltrate. The Emergency Department started levofloxacin, and I will add vancomycin and cefepime to cover healthcare-associated infections as the patient is a resident of a nursing home.  2.  Respiratory failure. The patient has been placed on BiPAP by the Emergency Department. We will obtain a blood gas to assess her oxygenation and ventilation.  3.  Sepsis. The patient meets criteria by tachycardia and tachypnea. She is hemodynamically stable at this time and remains so after antiarrhythmics were pushed. We will continue to monitor her and give antibiotics as listed above.  4.  Tachycardia. Initially seemed to be supraventricular tachycardia. We tried giving diltiazem 10 mg IV twice. This slowed the patient's rhythm from approximately 146 to 104. At that time, the rhythm transformed into atrial fibrillation with RVR. I have initiated a diltiazem drip, and we will admit the patient to the ICU.   5.  Elevated troponin. The patient has had this many times before. She has had an echocardiogram which shows no acute wall motion abnormalities, and cardiology has recommended medical management. Nonetheless, I will start heparin on the patient at this time. Her elevated troponin is likely due to demand ischemia secondary to tachycardia. I will continue to cycle her cardiac enzymes, and I will leave the decision to order a cardiology consult to the discretion of the primary treatment team.  6.  Hypertension, controlled. We will continue the patient's metoprolol which should help with  her tachyarrhythmia. I have held amlodipine, of course, while she is currently on the diltiazem drip.  7.  Hypothyroidism. We will check a thyroid stimulating hormone level. We will continue her Synthroid for now and adjust the dose if needed.  8.  Dementia, stable.  9.  Deep vein thrombosis prophylaxis. The patient is currently on a treatment dose of heparin. We will also placed some compression stockings on her as well.  10. Gastrointestinal prophylaxis, on pantoprazole.   It is not clear if the patient is a full code at this time. We need to identify her next of kin and/or health care power of attorney to clarify her wishes. For now, she is a full code.   TIME SPENT ON ADMISSION ORDERS AND CRITICAL PATIENT CARE: Approximately 45 minutes.     ____________________________ Kelton PillarMichael S. Sheryle Hailiamond, MD msd:JT D: 01/30/2014 07:18:01 ET T: 01/30/2014 08:18:58 ET JOB#: [id number]  cc: Kelton PillarMichael S. Sheryle Hailiamond, MD, <Dictator> Kelton PillarMICHAEL S Real Cona MD ELECTRONICALLY SIGNED 02/01/2014 2:32

## 2014-08-27 NOTE — Consult Note (Signed)
PATIENT NAME:  Lori Elliott, Lori Elliott MR#:  [id number] DATE OF BIRTH:  [birth year]  DATE OF CONSULTATION:  11/11/2013  REFERRING PHYSICIAN:  Katharina Caperima Vaickute, MD.  PRIMARY PHYSICIAN: Leanna SatoLinda M. Miles, MD.  INDICATION: Shortness of breath, congestion, possible heart failure, paroxysmal atrial fibrillation.   HISTORY OF PRESENT ILLNESS: [name] is an 81 year old female, past medical history of syncope admitted back in January of 2013.  At the time, she was noted to have elevated troponins. She was treated medically by Dr. Gwen PoundsKowalski. The patient presents back to the hospital now complaining of cough, congestion for about the last 2 weeks, [facility] [place].  While she was there, states that she congestion and cough and needed further evaluation. She was afebrile, room air saturations were 90% to 93%.  She had cough, congestion, yellow phlegm. Denied chest pain. Chest x-ray was unremarkable, but troponin was slightly elevated so she was admitted for further evaluation and care.   PAST MEDICAL HISTORY: Syncope, elevated troponin, urinary tract infection, history of forearm fracture.  FAMILY HISTORY:   Strokes, glaucoma, heart attack.   SOCIAL HISTORY: Widow [facility] [place], retired Runner, broadcasting/film/videoteacher. No smoking or alcohol consumption.   MEDICATIONS: Levothyroxine 25 mcg a day, amlodipine 5 mg a day, aspirin 81 mg a day, Cerovite antioxidant tablet once a day, lovastatin 40 a day, Travatan 0.004% eyedrops at bedtime.   ALLERGIES: None.   REVIEW OF SYSTEMS:  Difficult to obtain. The patient slightly confused, has dementia.  Denies any blackouts or syncope. No nausea or vomiting. No fever, no chills, no sweats. No weight loss, no weight gain. No hemoptysis or hematemesis. No bright red blood per rectum, has a history of dementia, slightly confused, congestion, coughing.   PHYSICAL EXAMINATION: VITAL SIGNS: Blood pressure 130/70, pulse of 90, respiratory rate of 16,  afebrile.  HEENT: Normocephalic, atraumatic. Pupils equal and reactive to light.  NECK: Supple. No significant JVD, bruits, or adenopathy.  LUNGS: Clear to auscultation and percussion. No significant wheeze, rhonchi, or rales.  ABDOMEN: Benign.  EXTREMITIES: Within normal limits.  NEUROLOGIC: Intact.  SKIN: Normal.  LABORATORY DATA:  Glucose 154. BNP otherwise unremarkable. Troponin 0.049. CBC normal. White count 6.9, hemoglobin 15, platelet count of 196,000. Urinalysis unremarkable.   EKG: Normal sinus rhythm, nonspecific ST-T changes, rate of about 90.   ASSESSMENT: 1.  Elevated troponins.  2.  Chronic obstructive lung disease. 3.  Bronchitis.  4.  Hyperglycemia.  5.  Obesity.  6.  Dementia.  7.  Hyperlipidemia.   PLAN:  Continue current medications. Rule out for myocardial infarction. Followup cardiac enzymes. Followup EKG, oxygen therapy. Would also consider antibiotic therapy for possible bronchitis. Continue blood pressure control with amlodipine. Continue levothyroxine for thyroid disease, aspirin for secondary prevention, eyedrops for glaucoma, will follow up troponin, probably demand ischemia. I do not recommend cardiac catheterization at this point. Consider echocardiogram. Have the patient continue to follow. Hopefully, no further intervention is necessary. DVT prophylaxis will be helpful.     ____________________________ Bobbie Stackwayne D. Juliann Paresallwood, MD ddc:ds D: 11/11/2013 15:19:10 ET T: 11/11/2013 19:19:48 ET JOB#: [id number]  cc: Dwayne D. Juliann Paresallwood, MD, <Dictator> Alwyn PeaWAYNE D CALLWOOD MD ELECTRONICALLY SIGNED 12/17/2013 12:30

## 2014-08-27 NOTE — H&P (Signed)
PATIENT NAME:  Lori Elliott, Lori Elliott MR#:  [id number] DATE OF BIRTH:  Aug 19, 1928  DATE OF ADMISSION:  11/10/2013  PRIMARY CARE PHYSICIAN: Leanna Sato, MD  HISTORY OF PRESENT ILLNESS: [name] with past medical history significant for history of syncope and admission for the same in January 2013. At that time, she was noted to have elevated troponin, for which medical therapy only was recommended by Dr. Gwen Pounds. She presents back to the hospital with complaints of coughing and chest congestion for approximately 2 weeks. She is coming via EMS from [facility] [place]. Apparently, facility called primary care physician, and primary care physician recommended to come to Emergency Room for further evaluation and treatment. She denied any chest pains or pain at all. She was afebrile, and her room air saturations were 90% to 93% per EMS. The patient herself admits of coughing with yellowish phlegm. Denies any significant chest pains. Admits of wheezing. On arrival to the hospital, her chest x-ray was unremarkable; however, the patient's troponin was found to be elevated at 0.49, and hospitalist services were contacted for admission.   PAST MEDICAL HISTORY: Significant for history of admission in January 2013 for syncope. At that time, she had carotid ultrasound done. She was also noted to have elevated troponin for which medical therapy was recommended by Dr. Gwen Pounds. History of hypertension, hypothyroidism, dementia. History of altered mental status admission in January 2012. She had urinary tract infection at that time. She was also noted to have elevated troponin. At that time, the patient had echocardiogram done, which showed no wall motion abnormalities and ejection fraction was found to be 55%. Cardiology consultation was obtained, and it was felt that it was not secondary to acute coronary event. History of forearm fracture, according to the patient.   FAMILY  HISTORY: Positive for strokes, glaucoma. Also, the patient's father had a heart attack.   SOCIAL HISTORY: The patient is widowed and lives at home. 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 Nonsmoker. No alcohol or drug abuse.   MEDICATIONS: According to medical records, the patient is on:  1. Levothyroxine 25 mcg p.o. daily. 2. Amlodipine 5 mg p.o. daily. 3. Aspirin 81 mg p.o. daily. 4. Cerovite antioxidant tablet once daily.  5. Lovastatin 40 mg p.o. daily. 6. Travatan 0.004% drops to both eyes at bedtime.   ALLERGIES: No known drug allergies.   REVIEW OF SYSTEMS: Difficult to obtain as the patient has significant dementia. Admits of having some weight gain. Admits of glaucoma in family, but denies any glaucoma herself. Admits of no cataract; however, during evaluation, it seems to be that she has implanted lenses bilaterally. She does have cough, wheezes as well as mild hemoptysis and dyspnea on exertion. GENERAL: Otherwise, denies any fevers or chills, weight loss.  EYES: Denies any blurry vision, double vision, cataracts.  ENT: Denies any tinnitus, allergies, epistaxis, sinus pain, dentures, difficulty swallowing. RESPIRATORY: Denies any dyspnea, shortness of breath.  CARDIOVASCULAR: Denies any chest pains, orthopnea, edema, arrhythmias, palpitations or syncope.  GASTROINTESTINAL: Denies any nausea, vomiting, diarrhea or constipation.  GENITOURINARY: Denies any dysuria, hematuria, frequency or incontinence.  ENDOCRINOLOGY: Denies polydipsia, nocturia, thyroid problems, heat or cold intolerance or thirst.  HEMATOLOGIC: Denies anemia, easy bruising or bleeding, swollen glands.  SKIN: Denies any acne, rashes, lesions or change in moles.  MUSCULOSKELETAL: Denies arthritis, cramps, swelling, gout.  NEUROLOGIC: Denies numbness, epilepsy or tremor.  PSYCHIATRIC: Denies anxiety, insomnia, depression.  PHYSICAL EXAMINATION:  VITAL SIGNS: On arrival to the  hospital, temperature was 98.3, pulse was 92, respirations were 23, blood pressure is unfortunately not measured, O2 saturations were 94% on room air.  GENERAL: This is well-developed, obese Caucasian female in no significant distress, lying on the stretcher. She is using nebulizer to breathe.  HEENT: Her pupils are equal and reactive to light. Extraocular movements are intact. No icterus or conjunctivitis. Has normal hearing. No pharyngeal erythema. Mucosa is moist.  NECK: No masses, supple, nontender. Thyroid is not enlarged. No adenopathy. No JVD or carotid bruits bilaterally. Full range of motion.  LUNGS: Wheezes bilaterally as well as a few rhonchi. Diminished breath sounds were noted. The patient does have some labored inspiration to breathe and speaks short sentences. She does have increased effort to breathe. She is in mild respiratory distress.  CARDIOVASCULAR: S1, S2 appreciated. The rhythm is regular. PMI not lateralized. Chest is nontender to palpation. Pedal pulses 1+. Trace lower extremity edema. No calf tenderness or cyanosis was noted.  ABDOMEN: Soft, nontender. Bowel sounds are present. No hepatosplenomegaly or masses were noted.  RECTAL: Deferred.  MUSCULOSKELETAL: Able to move all extremities. No cyanosis, degenerative joint disease or kyphosis. Gait is not tested.  SKIN: Did not reveal any rashes, lesions, erythema, nodularity, induration. It was warm and dry to palpation.  LYMPHATIC: No adenopathy in the cervical region.  NEUROLOGICAL: Cranial nerves grossly intact. Sensory is intact. No dysarthria or aphasia. PSYCHIATRIC: The patient is alert and oriented to time, person and place. Cooperative. Memory is poor. No significant confusion, agitation or depression was noted.   LABORATORY DATA: BMP: Glucose of 154, otherwise BMP unremarkable. Troponin is elevated at 0.49, MB fraction is normal at 2.9. CBC within normal limits, with white blood cell count 6.9, hemoglobin 15.0, platelet  count 196. Urinalysis is unremarkable. EKG showed normal sinus rhythm with sinus arrhythmia at 78 beats per minute, LVH with QRS widening. QRS duration is 124 msec. No significant ST-T changes were noted except in V2, where T depression was noted. As compared to prior EKG done in May 2015, T depression in V2 is new.   ASSESSMENT AND PLAN:  1. Elevated troponin of unclear etiology, questionable cardiac versus demand ischemia. Admit the patient to medical floor. Start her on metoprolol, aspirin and nitroglycerin as well as heparin subcutaneous. Follow cardiac enzymes and get cardiologist involved again for further recommendations.  2. Chronic obstructive pulmonary disease exacerbation. Start the patient on steroids, nebulizers, inhalers.  3. Acute bronchitis. Initiate the patient on Levaquin. Get sputum cultures if possible.  4. Hyperglycemia. Get hemoglobin A1c.   TIME SPENT: 50 minutes.   ____________________________ Katharina Caperima Shahram Alexopoulos, MD rv:lb D: 11/10/2013 12:42:29 ET T: 11/10/2013 13:20:14 ET JOB#: [id number]  cc: Katharina Caperima Penny Arrambide, MD, <Dictator> Leanna SatoLinda M. Miles, MD Marialice Newkirk Winona LegatoVAICKUTE MD ELECTRONICALLY SIGNED 11/25/2013 16:27
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
## 2014-08-28 NOTE — Discharge Summary (Signed)
PATIENT NAME:  Lori Elliott, Charell MR#:  [id number] DATE OF BIRTH:  [birth year]  DATE OF ADMISSION:  05/07/2011 DATE OF DISCHARGE:  05/08/2011  PRESENTING COMPLAINT: Fall/syncopal episode.   DISCHARGE DIAGNOSES:  1. Syncope, etiology unclear. There is a possibility the patient had a mechanical fall.  2. Hypertension.  3. Hypothyroidism.   CONDITION ON DISCHARGE: Fair. Vitals stable. Blood pressure 173/75. Previously blood pressure was 162/72.   MEDICATIONS ON DISCHARGE:  1. Metoprolol 25 mg b.i.d.  2. Synthroid 50 mcg p.o. daily.  3. Aspirin 81 mg p.o. daily.  4. Multivitamin p.o. daily.  5. Tylenol 325, two tablets every four hours as needed.   DIET: Low sodium.   FOLLOW-UP: 1. Home health to follow up.  2. Follow-up with Dr. Darreld McleanLinda Miles in 1 to 2 weeks.   RESULTS: Ultrasound carotid Doppler: No hemodynamically significant stenosis. Basic metabolic panel within normal limits except chloride of 110, sodium 147. Troponin 0.38, 0.6 and 0.38. Urinalysis negative for urinary tract infection. TSH 7.05. EKG showed sinus rhythm with PACs. Magnesium 2.2. A CT of the head showed no acute intracranial process.   CONSULTATION: Cardiology consultation with Dr. Gwen PoundsKowalski.   BRIEF SUMMARY OF HOSPITAL COURSE: [name] is an 81 year old Caucasian female who comes in from home with:  1. Syncopal episode. The patient was somewhat a poor historian. She remembers falling in the bathroom and hitting her head. She did have some local oozing, however, no major trauma was noted on CT of the head. Her symptoms resolved. Her ultrasound carotid Doppler was negative. She remained in sinus rhythm without any arrhythmia. She was continued on a low dose baby aspirin. No seizures were noted as well.  2. Mild elevated troponin without EKG changes, appears to be demand ischemia. The patient was seen by Dr. Gwen PoundsKowalski. No further diagnostics were recommended. The patient was asymptomatic and her cardiac enzymes were  borderline elevated.  3. Mild hyponatremia. She did receive IV fluids.  4. History of systemic hypertension. The patient is on p.o. Metoprolol.  5. Hypothyroidism. Adjusted dose of Synthroid.   FOLLOW-UP:  1. The patient will be followed by home health at home given her noncompliance at times with medication.  2. She will follow up with Dr. Darreld McleanLinda Miles in one to two weeks.   TIME SPENT: 40 minutes.  ____________________________ Wylie HailSona A. Allena KatzPatel, MD sap:ap D: 05/08/2011 13:52:07 ET T: 05/09/2011 14:02:52 ET JOB#: [id number]  cc: Eileen Kangas A. Allena KatzPatel, MD, <Dictator> Leanna SatoLinda M. Miles, MD Willow OraSONA A Lillyona Polasek MD ELECTRONICALLY SIGNED 05/09/2011 14:45

## 2014-08-28 NOTE — Consult Note (Signed)
Present Illness 81 year old female with known hypertension, borderline hyperlipidemia with acute onset of syncope.  The patient has had a fall of unknown etiology.  The patient did not hurt herself during this period of time.  The patient has been seen in the emergency room and since has not had any evidence of rhythm disturbances.  By telemetry.  EKG shows normal sinus rhythm with preventricular contractions and nonspecific ST changes.  The patient does have a slight elevation of troponin, possible consistent with demand ischemia, although there is no current apparent source and will further investigate the possibility of myocardial infarction.  The patient currently has had no chest pain or shortness of breath consistent with congestive heart failure.  CAT scan has shown no evidence of stroke  Family history No family members with early onset of cardiovascular disease  Social history Patient currently denies alcohol or tobacco use   Physical Exam:   GEN WD    HEENT pink conjunctivae    NECK No masses    RESP normal resp effort    CARD Regular rate and rhythm    ABD denies tenderness  soft    LYMPH negative neck    EXTR negative cyanosis/clubbing    SKIN No rashes    NEURO cranial nerves intact    PSYCH alert   Review of Systems:   Subjective/Chief Complaint patient does not really understand why she is here    ROS Pt not able to provide ROS    Medications/Allergies Reviewed Medications/Allergies reviewed     Glaucoma:    HTN:    cva:   Home Medications:  Aspir-Low 81 mg oral enteric coated tablet: 1  orally once a day , Active  Synthroid: 25 mcg  once a day , Active  metoprolol 25 mg tablet: 1 tab(s) orally 2 times a day x 30 days , Active  Tylenol 325 mg tablet: 2 tab(s) orally every 4 hours as needed  , Active  Levaquin 500 mg tablet: 1 tab(s) orally every 24 hours x 2 days , Active  multivitamin Multiple Vitamins tablet: 1 tab(s) orally once a day x 30  days , Active  Routine Hem:  31-Dec-12 18:50    WBC (CBC) -   RBC (CBC) -   Hemoglobin (CBC) -   Hematocrit (CBC) -   Platelet Count (CBC) -   MCV -   MCH -   MCHC -   RDW -  Routine Chem:  31-Dec-12 18:50    Glucose, Serum 101   BUN 19   Creatinine (comp) 0.70   Sodium, Serum 146   Potassium, Serum 4.0   Chloride, Serum 110   CO2, Serum 28   Calcium (Total), Serum 8.4  Hepatic:  31-Dec-12 18:50    Bilirubin, Total 0.3   Alkaline Phosphatase 95   SGPT (ALT) 37   SGOT (AST) 47   Total Protein, Serum 7.3   Albumin, Serum 3.4  Routine Chem:  31-Dec-12 18:50    Osmolality (calc) 293   eGFR (African American) >60   eGFR (Non-African American) >60   Anion Gap 8  Cardiac:  31-Dec-12 18:50    CK, Total 424   CPK-MB, Serum 4.8   Troponin I 0.38  Routine Chem:  31-Dec-12 18:50    Magnesium, Serum 2.2  Cardiology:  31-Dec-12 19:14    Ventricular Rate 65   Atrial Rate 65   P-R Interval 152   QRS Duration 120   QT 466   QTc 484  P Axis 65   R Axis 0   T Axis 84  Routine UA:  31-Dec-12 20:05    Color (UA) Yellow   Clarity (UA) Hazy   Glucose (UA) Negative   Bilirubin (UA) Negative   Ketones (UA) Negative   Specific Gravity (UA) 1.014   Blood (UA) Negative   pH (UA) 7.0   Protein (UA) Negative   Nitrite (UA) Negative   Leukocyte Esterase (UA) Negative   RBC (UA) 1 /HPF   WBC (UA) 1 /HPF   Bacteria (UA) TRACE   Epithelial Cells (UA) <1 /HPF   Mucous (UA) PRESENT  Thyroid:  31-Dec-12 20:05    Thyroid Stimulating Hormone 7.05  Cardiac:  01-Jan-13 03:32    CK, Total 209   CPK-MB, Serum 3.3   Troponin I 0.36    12:50    CK, Total 154   CPK-MB, Serum 2.3   Troponin I 0.38   EKG:   EKG Interp. by me    Interpretation normal sinus rhythm with preventricular contractions and nonspecific ST changes    NKDA: None  Vital Signs/Nurse's Notes: **Vital Signs.:   01-Jan-13 15:50   Vital Signs Type Routine   Temperature Temperature (F) 97.4    Celsius 36.3   Temperature Source oral   Pulse Pulse 53   Pulse source per Dinamap   Respirations Respirations 20   Systolic BP Systolic BP 937   Diastolic BP (mmHg) Diastolic BP (mmHg) 70   Mean BP 93   BP Source Dinamap   Pulse Ox % Pulse Ox % 95   Pulse Ox Activity Level  At rest   Oxygen Delivery Room Air/ 21 %     Impression 81 year old female with known hypertension, hyperlipidemia, other cardiovascular risk factors with an acute onset of syncope of unknown etiology without evidence of rhythm disturbances, but slight elevation of troponin of unknown etiology, may be further medical management and treatment options    Plan 1.  Continue telemetry with telemetry unit nursing for reason for syncope. 2.  Serial ECG and enzymes to assess for myocardial infarction. 3.  Echocardiogram for LV systolic dysfunction, valvular heart disease contributing to above. 4.  Ambulate and follow for above issues. 5.  Further diagnostic testing after above   Electronic Signatures: Corey Skains (MD)  (Signed 01-Jan-13 19:42)  Authored: General Aspect/Present Illness, History and Physical Exam, Review of System, Past Medical History, Home Medications, Labs, EKG , Allergies, Vital Signs/Nurse's Notes, Impression/Plan   Last Updated: 01-Jan-13 19:42 by Corey Skains (MD)

## 2014-11-14 IMAGING — CT CT ABD-PELV W/O CM
2 of 6 series · 16 of 46 positions shown, 18 images · non-contrast
Comparison: Soft tissue ultrasound 02/10/2014.

CLINICAL DATA: Follow-up right site hematoma, history of dementia

EXAM:
CT ABDOMEN AND PELVIS WITHOUT CONTRAST
TECHNIQUE: Multidetector CT imaging of the abdomen and pelvis was performed
following the standard protocol without IV contrast.

[Series 5: routine abd pel (id) · axial · 0.97mm/px · z∈[-1158,-728]mm · 13 of 98 slices shown, 15 images]
[im 6/98  soft-tissue]
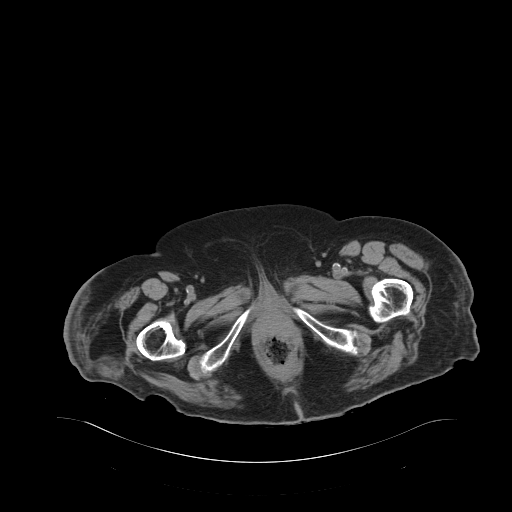
[im 6/98  bone]
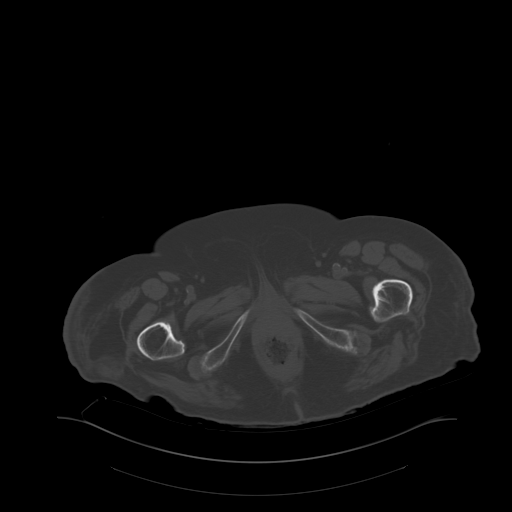
[im 16/98  soft-tissue]
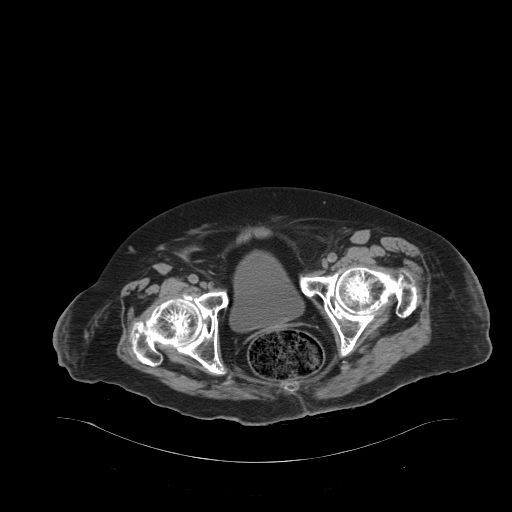
[im 21/98  soft-tissue]
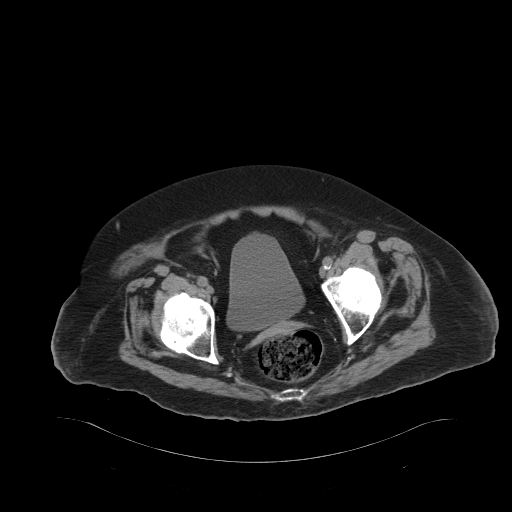
[im 26/98  soft-tissue]
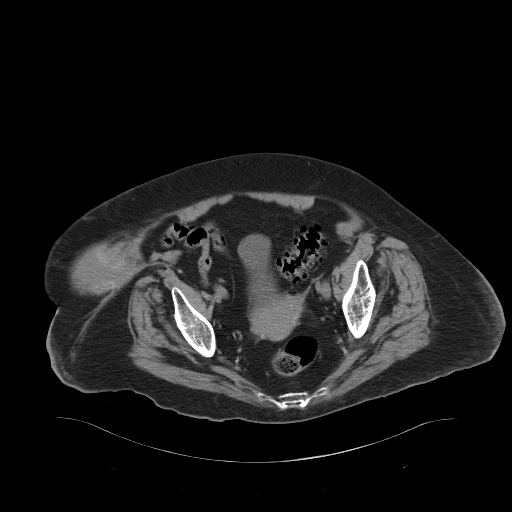
[im 36/98  soft-tissue]
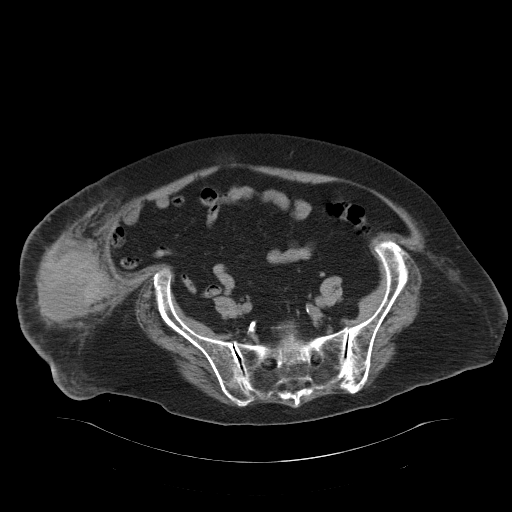
[im 41/98  soft-tissue]
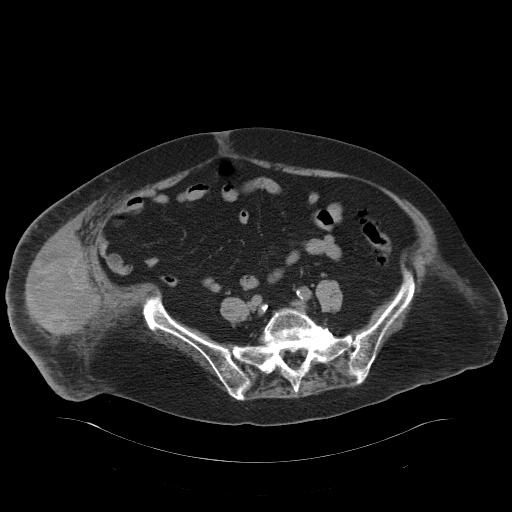
[im 52/98  soft-tissue]
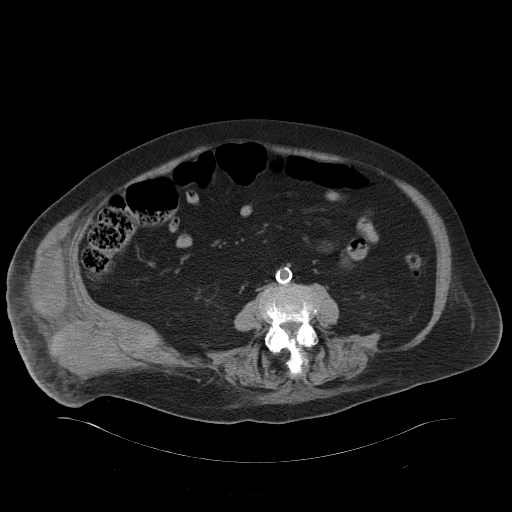
[im 57/98  soft-tissue]
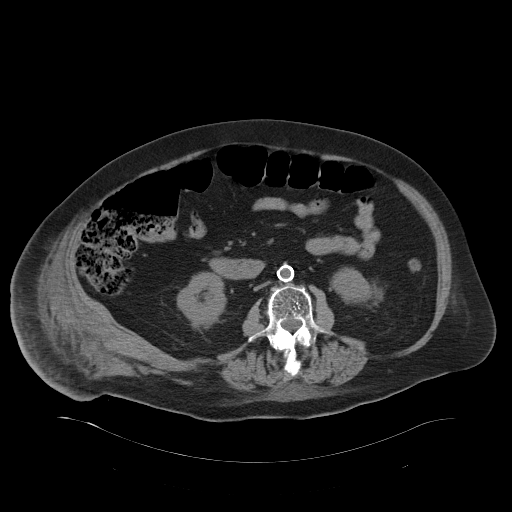
[im 62/98  soft-tissue]
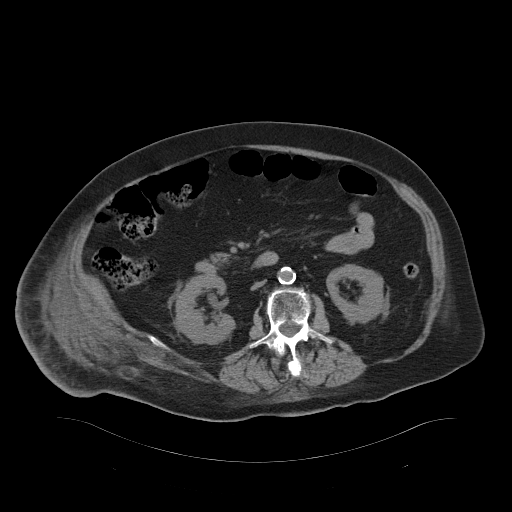
[im 62/98  bone]
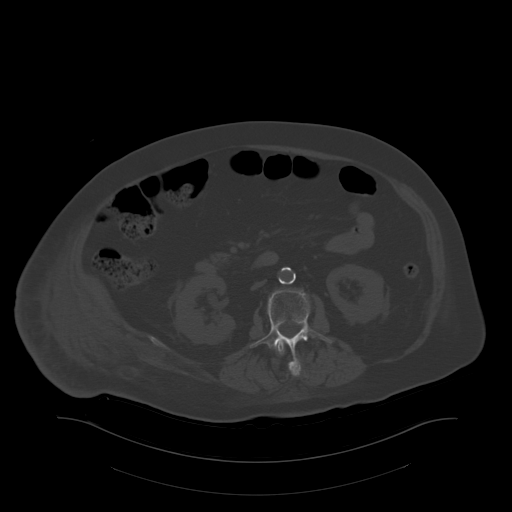
[im 72/98  soft-tissue]
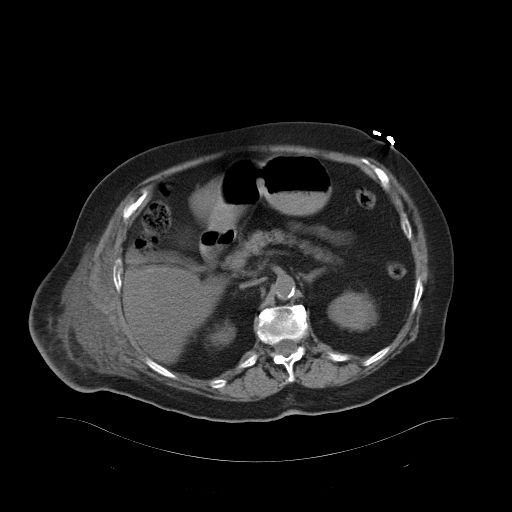
[im 77/98  soft-tissue]
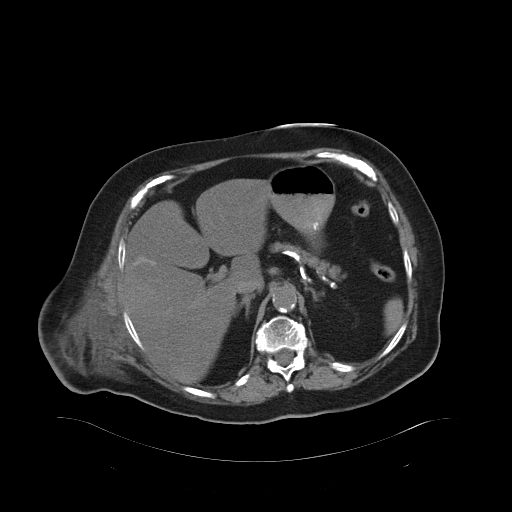
[im 82/98  soft-tissue]
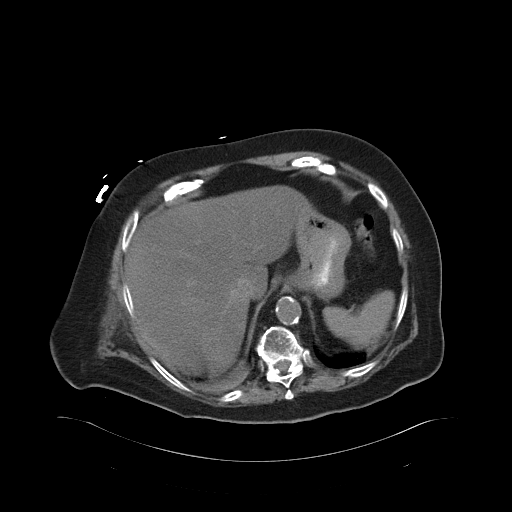
[im 92/98  soft-tissue]
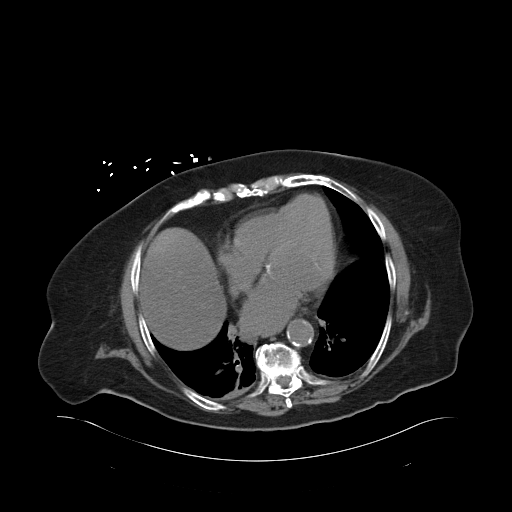

[Series 10: cor routine abd pel wo · coronal · 0.94mm/px · 3 of 140 slices shown]
[im 47/140  soft-tissue]
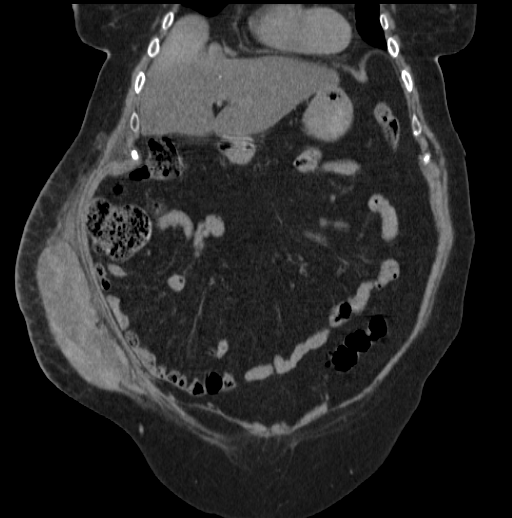
[im 62/140  soft-tissue]
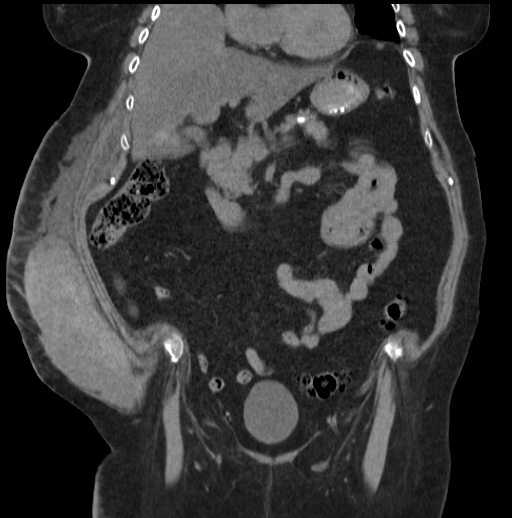
[im 78/140  soft-tissue]
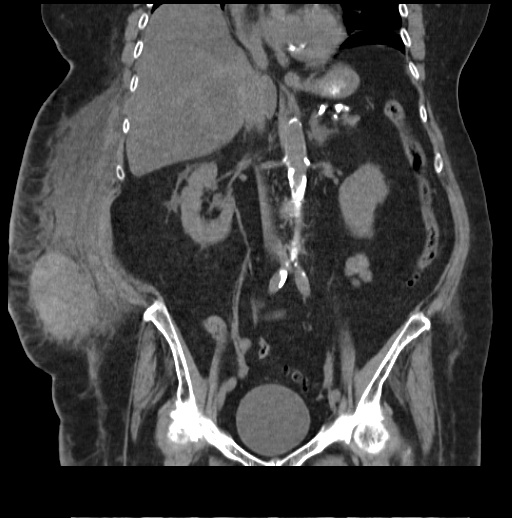

[16 of 46 positions shown; findings below may reference images not displayed]

FINDINGS: Sagittal images of the spine shows diffuse osteopenia. Extensive
atherosclerotic calcifications of abdominal aorta and iliac arteries
are noted. There is mild hepatic fatty infiltration. Lung bases are
unremarkable. No calcified gallstones are noted within gallbladder.
Atherosclerotic calcifications of splenic artery. Unenhanced
pancreas, spleen and adrenal glands are unremarkable.

Unenhanced kidneys are unremarkable. No hydronephrosis or
hydroureter.

Scattered diverticula are noted descending colon. No evidence of
acute diverticulitis. Multiple sigmoid colon diverticula. No
evidence of acute diverticulitis. Moderate stool noted within
rectum. The rectum measures about 7 cm in diameter suspicious for
mild fecal impaction. The uterus is atrophic. The urinary bladder is
unremarkable.

Again noted a large deep subcutaneous right flank hematoma. Measures
at least 14.8 x 7.3 cm. The hematoma is best visualized in coronal
image 61 measures 18.5 cm cranial caudally by 6.3 cm thickness.

There is a small intra muscular hematoma in right flank muscles just
above the right iliac bone measures about 3.6 x 2.7 cm.

Mild stranding of subcutaneous fat in right flank.
IMPRESSION: 1. Again noted a large deep subcutaneous hematoma in right flank
measures 14.8 x 18.5 cm. Slight increased in size from prior exam
when measures 12.2 x 15 cm. There is a small intramuscular component
just above the right iliac bone measures 2.8 x 3.6 cm.
2. No acute intra-abdominal abnormality.
3. Colonic diverticula are noted descending colon and sigmoid colon.
No evidence of acute diverticulitis.
4. No acute fractures are identified. Degenerative changes
thoracolumbar spine.
5. Abundant stool is noted within rectum suspicious for mild fecal
impaction.

## 2016-06-12 ENCOUNTER — Encounter
Admission: RE | Admit: 2016-06-12 | Discharge: 2016-06-12 | Disposition: A | Discharge status: Home / Self Care | Payer: Medicare Other | Source: Ambulatory Visit | Attending: Internal Medicine | Admitting: Internal Medicine

## 2016-06-12 DIAGNOSIS — I509 Heart failure, unspecified: Secondary | ICD-10-CM | POA: Insufficient documentation

## 2016-06-18 DIAGNOSIS — I509 Heart failure, unspecified: Secondary | ICD-10-CM | POA: Diagnosis not present

## 2016-06-18 LAB — CBC WITH DIFFERENTIAL/PLATELET
BASOS PCT: 1 %
Basophils Absolute: 0 10*3/uL (ref 0–0.1)
EOS ABS: 0.2 10*3/uL (ref 0–0.7)
Eosinophils Relative: 3 %
HCT: 40.5 % (ref 35.0–47.0)
HEMOGLOBIN: 13.7 g/dL (ref 12.0–16.0)
Lymphocytes Relative: 40 %
Lymphs Abs: 2.7 10*3/uL (ref 1.0–3.6)
MCH: 31.5 pg (ref 26.0–34.0)
MCHC: 33.9 g/dL (ref 32.0–36.0)
MCV: 93 fL (ref 80.0–100.0)
MONOS PCT: 7 %
Monocytes Absolute: 0.5 10*3/uL (ref 0.2–0.9)
NEUTROS PCT: 49 %
Neutro Abs: 3.4 10*3/uL (ref 1.4–6.5)
PLATELETS: 241 10*3/uL (ref 150–440)
RBC: 4.36 MIL/uL (ref 3.80–5.20)
RDW: 13.2 % (ref 11.5–14.5)
WBC: 6.7 10*3/uL (ref 3.6–11.0)

## 2016-06-18 LAB — COMPREHENSIVE METABOLIC PANEL
ALBUMIN: 3.2 g/dL — AB (ref 3.5–5.0)
ALK PHOS: 71 U/L (ref 38–126)
ALT: 57 U/L — ABNORMAL HIGH (ref 14–54)
ANION GAP: 7 (ref 5–15)
AST: 60 U/L — ABNORMAL HIGH (ref 15–41)
BUN: 15 mg/dL (ref 6–20)
CHLORIDE: 104 mmol/L (ref 101–111)
CO2: 31 mmol/L (ref 22–32)
Calcium: 8.6 mg/dL — ABNORMAL LOW (ref 8.9–10.3)
Creatinine, Ser: 1.09 mg/dL — ABNORMAL HIGH (ref 0.44–1.00)
GFR calc Af Amer: 52 mL/min — ABNORMAL LOW (ref >60)
GFR calc non Af Amer: 45 mL/min — ABNORMAL LOW (ref >60)
GLUCOSE: 97 mg/dL (ref 65–99)
POTASSIUM: 3 mmol/L — AB (ref 3.5–5.1)
SODIUM: 142 mmol/L (ref 135–145)
Total Bilirubin: 0.4 mg/dL (ref 0.3–1.2)
Total Protein: 6.9 g/dL (ref 6.5–8.1)

## 2016-06-18 LAB — TSH: TSH: 1.184 u[IU]/mL (ref 0.350–4.500)

## 2016-06-27 ENCOUNTER — Non-Acute Institutional Stay (SKILLED_NURSING_FACILITY): Payer: Medicare Other | Admitting: Gerontology

## 2016-06-27 DIAGNOSIS — F0280 Dementia in other diseases classified elsewhere without behavioral disturbance: Secondary | ICD-10-CM | POA: Insufficient documentation

## 2016-06-27 DIAGNOSIS — J9610 Chronic respiratory failure, unspecified whether with hypoxia or hypercapnia: Secondary | ICD-10-CM | POA: Diagnosis not present

## 2016-06-27 NOTE — Progress Notes (Signed)
Location:      Place of Service:  SNF (31) Provider:  Lorenso QuarryShannon Lilee Aldea, NP-C  No primary care provider on file.  No care team member to display  Extended Emergency Contact Information Primary Emergency Contact: Violet BaldyJOHNSON,WINFRED L Address: [address]          [place],  [id number] Home Phone: [phone] Relation: None Secondary Emergency Contact: Lorelle FormosaBELL,LINDA J Address: [address]          [place], [place] [address] Home Phone: [phone] Relation: None  Code Status:  DNR Goals of care: Advanced Directive information No flowsheet data found.   Chief Complaint  Patient presents with  . Medicare Wellness    HPI:  Pt is a 83 y.o. female seen today for medical management of chronic diseases. She was seen for evaluation of stability of her chronic respiratory failure and dementia. Pt is not on O2 during the day. Requires oxygen 3 l [place] at bedtime. She was out in the common area with other residents. She appeared relaxed and at ease, not anxious.  Pt does need extensive assistance with ADLs and mobility, [facility]. She calls out for her husband at times. She is incontinent of bowel and bladder. Impaired short and long term memory. Unable to hold a full, extensive conversation. Pt denies n/v/d/f/c/cp/sob/ha/abd pain/dizziness. Denies pain at this time. VSS. No other complaints.    No past medical history on file. No past surgical history on file.  Allergies not on file  Allergies as of 06/27/2016   Not on File     Medication List    as of 06/27/2016 12:57 PM   You have not been prescribed any medications.     Review of Systems  Constitutional: Negative for activity change, appetite change, chills, diaphoresis and fever.  HENT: Negative for congestion, sneezing, sore throat, trouble swallowing and voice change.   Eyes: Negative for pain, redness and visual disturbance.  Respiratory: Negative for apnea, cough, choking, chest tightness,  shortness of breath and wheezing.   Cardiovascular: Negative for chest pain, palpitations and leg swelling.  Gastrointestinal: Negative for abdominal distention, abdominal pain, constipation, diarrhea and nausea.  Genitourinary: Negative for difficulty urinating, dysuria, frequency and urgency.  Musculoskeletal: Negative for back pain, gait problem and myalgias. Arthralgias: typical arthritis.  Skin: Negative for color change, pallor, rash and wound.  Neurological: Negative for dizziness, tremors, syncope, speech difficulty, weakness, numbness and headaches.  Psychiatric/Behavioral: Negative for agitation and behavioral problems.  All other systems reviewed and are negative.    There is no immunization history on file for this patient. There are no preventive care reminders to display for this patient. No flowsheet data found. Functional Status Survey:    Vitals:   06/17/16 1154  BP: (!) 106/46  Pulse: 64  Resp: 20  Temp: 97.9 F (36.6 C)  SpO2: 97%  Weight: 167 lb 3.2 oz (75.8 kg)   There is no height or weight on file to calculate BMI. Physical Exam  Constitutional: Vital signs are normal. She appears well-developed and well-nourished. She is active and cooperative. She does not appear ill. No distress.  HENT:  Head: Normocephalic and atraumatic.  Mouth/Throat: Uvula is midline, oropharynx is clear and moist and mucous membranes are normal. Mucous membranes are not pale, not dry and not cyanotic.  Eyes: Conjunctivae, EOM and lids are normal. Pupils are equal, round, and reactive to light.  Neck: Trachea normal, normal range of motion and full passive range of motion without  pain. Neck supple. No JVD present. No tracheal deviation, no edema and no erythema present. No thyromegaly present.  Cardiovascular: Normal rate, regular rhythm, normal heart sounds, intact distal pulses and normal pulses.  Exam reveals no gallop, no distant heart sounds and no friction rub.   No murmur  heard. Pulmonary/Chest: Effort normal and breath sounds normal. No accessory muscle usage. No respiratory distress. She has no decreased breath sounds. She has no wheezes. She has no rhonchi. She has no rales. She exhibits no tenderness.  Abdominal: Normal appearance and bowel sounds are normal. She exhibits no distension and no ascites. There is no tenderness.  Musculoskeletal: Normal range of motion. She exhibits no edema or tenderness.  Expected osteoarthritis, stiffness  Neurological: She is alert. She has normal strength. She is disoriented.  confused  Skin: Skin is warm, dry and intact. She is not diaphoretic. No cyanosis. No pallor. Nails show no clubbing.  Psychiatric: She has a normal mood and affect. Her speech is normal and behavior is normal. Judgment and thought content normal. Cognition and memory are normal.  Nursing note and vitals reviewed.   Labs reviewed:  Recent Labs  06/18/16 0750  NA 142  K 3.0*  CL 104  CO2 31  GLUCOSE 97  BUN 15  CREATININE 1.09*  CALCIUM 8.6*    Recent Labs  06/18/16 0750  AST 60*  ALT 57*  ALKPHOS 71  BILITOT 0.4  PROT 6.9  ALBUMIN 3.2*    Recent Labs  06/18/16 0750  WBC 6.7  NEUTROABS 3.4  HGB 13.7  HCT 40.5  MCV 93.0  PLT 241   Lab Results  Component Value Date   TSH 1.184 06/18/2016   Lab Results  Component Value Date   HGBA1C 6.9 (H) 02/09/2014   Lab Results  Component Value Date   CHOL 154 11/11/2013   HDL 51 11/11/2013   LDLCALC 81 11/11/2013   TRIG 110 11/11/2013    Significant Diagnostic Results in last 30 days:  No results found.  Assessment/Plan 1. Chronic respiratory failure, unsp w hypoxia or hypercapnia (HCC)  Continue oxygen 3 liters/ min [place] at HS  Duonebs IH BID for resp. failure  2. Dementia in other diseases classified elsewhere without behavioral disturbance  Continue Namenda 10 mg po BID for dementia  Continue Alprazolam 0.25 mg 1 tablet po Q 8 hours prn anxiety,  agitation   Family/ staff Communication:   Total Time:  Documentation:  Face to Face:  Family/Phone:   Labs/tests ordered:    Medication list reviewed and assessed for continued appropriateness. Monthly medication orders reviewed and signed.  Brynda Rim, NP-C Ger[facility] [id number] [place], [place] [address] Cell Phone (Mon-Fri 8am-5pm):  [phone] On Call:  [phone] & follow prompts after 5pm & weekends Office Phone:  [phone] Office Fax:  [phone]

## 2016-07-02 ENCOUNTER — Encounter: Payer: Self-pay | Admitting: Gerontology

## 2016-07-02 DIAGNOSIS — I4891 Unspecified atrial fibrillation: Secondary | ICD-10-CM | POA: Insufficient documentation

## 2016-07-04 ENCOUNTER — Encounter
Admission: RE | Admit: 2016-07-04 | Discharge: 2016-07-04 | Disposition: A | Discharge status: Home / Self Care | Payer: Self-pay | Source: Ambulatory Visit | Attending: Internal Medicine | Admitting: Internal Medicine

## 2016-08-04 ENCOUNTER — Encounter
Admission: RE | Admit: 2016-08-04 | Discharge: 2016-08-04 | Disposition: A | Discharge status: Home / Self Care | Payer: Medicare Other | Source: Ambulatory Visit | Attending: Internal Medicine | Admitting: Internal Medicine

## 2016-08-19 ENCOUNTER — Non-Acute Institutional Stay (SKILLED_NURSING_FACILITY): Payer: Medicare Other | Admitting: Gerontology

## 2016-08-19 DIAGNOSIS — S82401A Unspecified fracture of shaft of right fibula, initial encounter for closed fracture: Secondary | ICD-10-CM

## 2016-08-19 DIAGNOSIS — G8911 Acute pain due to trauma: Secondary | ICD-10-CM

## 2016-08-19 DIAGNOSIS — S82201A Unspecified fracture of shaft of right tibia, initial encounter for closed fracture: Secondary | ICD-10-CM

## 2016-08-19 NOTE — Progress Notes (Signed)
Location:      Place of Service:  SNF (31) Provider:  Lorenso Quarry, NP-C  No primary care provider on file.  No care team member to display  Extended Emergency Contact Information Primary Emergency Contact: Arnette Felts [place] Home Phone: [phone] Mobile Phone: [phone] Relation: Other Secondary Emergency Contact: Buchanan,Brad Address: [address]          [place], [place] [address] [place] Home Phone: [phone] Work Phone: [phone] Relation: Other  Code Status:  DNR Goals of care: Advanced Directive information No flowsheet data found.   Chief Complaint  Patient presents with  . Acute Visit    HPI:  Lori Elliott is a 83 y.o. female seen today for an acute visit for pain in the right leg. Lori Elliott was noted to have had a fall 4/13 with knee pain. Xrays obtained. Shows Right Tibial Metaphysis fracture and fibular neck fracture. Both Non-displaced. Order given 4/13 PM to place leg in a knee immobilizer. Non-weight bearing. Ice QID. Staff unable to obtain immobilizer over the weekend. Lori Elliott c/o severe pain in the right leg when moved. Will obtain repeat xrays to ensure fracture has not displaced and address pain. Lori Elliott is unable to give numerical pain value. However, Lori Elliott states "it hurts bad" and has facial grimacing and guarding. 2+ pedal pulse. No edema. Yellow bruising to anterior patella with open abrasion to the knee (covered with bandaid). Calves soft, supple. Negative Homan's sign. Will check on progress with immobilizer and address pain. VSS. No other complaints.    No past medical history on file. No past surgical history on file.  Allergies not on file  Allergies as of 08/19/2016   Not on File     Medication List    as of 08/19/2016  5:20 PM   You have not been prescribed any medications.     Review of Systems  Constitutional: Negative for activity change, appetite change, chills, diaphoresis and fever.  HENT: Negative for  congestion, sneezing, sore throat, trouble swallowing and voice change.   Respiratory: Negative for apnea, cough, choking, chest tightness, shortness of breath and wheezing.   Cardiovascular: Negative for chest pain, palpitations and leg swelling.  Gastrointestinal: Negative for abdominal distention, abdominal pain, constipation, diarrhea and nausea.  Genitourinary: Negative for difficulty urinating, dysuria, frequency and urgency.  Musculoskeletal: Positive for arthralgias (typical arthritis) and joint swelling. Negative for back pain, gait problem and myalgias.  Skin: Negative for color change, pallor, rash and wound.  Neurological: Negative for dizziness, tremors, syncope, speech difficulty, weakness, numbness and headaches.  Psychiatric/Behavioral: Negative for agitation and behavioral problems.  All other systems reviewed and are negative.    There is no immunization history on file for this patient. Pertinent  Health Maintenance Due  Topic Date Due  . DEXA SCAN  12/21/1995  . PNA vac Low Risk Adult (1 of 2 - PCV13) 12/21/1995  . INFLUENZA VACCINE  12/04/2016   No flowsheet data found. Functional Status Survey:    Vitals:   08/19/16 1030  BP: (!) 118/58  Pulse: 68  SpO2: 98%   There is no height or weight on file to calculate BMI. Physical Exam  Constitutional: Vital signs are normal. She appears well-developed and well-nourished. She is active and cooperative. She does not appear ill. No distress.  HENT:  Head: Normocephalic and atraumatic.  Mouth/Throat: Uvula is midline, oropharynx is clear and moist and mucous membranes are normal. Mucous membranes are not pale, not dry and  not cyanotic.  Eyes: Conjunctivae, EOM and lids are normal. Pupils are equal, round, and reactive to light.  Neck: Trachea normal, normal range of motion and full passive range of motion without pain. Neck supple. No JVD present. No tracheal deviation, no edema and no erythema present. No thyromegaly  present.  Cardiovascular: Normal rate, regular rhythm, normal heart sounds, intact distal pulses and normal pulses.  Exam reveals no gallop, no distant heart sounds and no friction rub.   No murmur heard. Pulses:      Dorsalis pedis pulses are 2+ on the right side, and 2+ on the left side.  Pulmonary/Chest: Effort normal and breath sounds normal. No accessory muscle usage. No respiratory distress. She has no decreased breath sounds. She has no wheezes. She has no rhonchi. She has no rales. She exhibits no tenderness.  Abdominal: Normal appearance and bowel sounds are normal. She exhibits no distension and no ascites. There is no tenderness.  Musculoskeletal: She exhibits no edema.       Right knee: She exhibits decreased range of motion and swelling. Tenderness found.  Expected osteoarthritis, stiffness; calves soft, supple. Negative Homan's sign  Neurological: She is alert. She has normal strength. She is disoriented.  confused  Skin: Skin is warm and dry. Abrasion and bruising (healing- proximal tibial) noted. No rash noted. She is not diaphoretic. No cyanosis or erythema. No pallor. Nails show no clubbing.     Psychiatric: She has a normal mood and affect. Her speech is normal and behavior is normal. Judgment and thought content normal. Cognition and memory are normal.  Nursing note and vitals reviewed.   Labs reviewed:  Recent Labs  06/18/16 0750  NA 142  K 3.0*  CL 104  CO2 31  GLUCOSE 97  BUN 15  CREATININE 1.09*  CALCIUM 8.6*    Recent Labs  06/18/16 0750  AST 60*  ALT 57*  ALKPHOS 71  BILITOT 0.4  PROT 6.9  ALBUMIN 3.2*    Recent Labs  06/18/16 0750  WBC 6.7  NEUTROABS 3.4  HGB 13.7  HCT 40.5  MCV 93.0  PLT 241   Lab Results  Component Value Date   TSH 1.184 06/18/2016   Lab Results  Component Value Date   HGBA1C 6.9 (H) 02/09/2014   Lab Results  Component Value Date   CHOL 154 11/11/2013   HDL 51 11/11/2013   LDLCALC 81 11/11/2013   TRIG  110 11/11/2013    Significant Diagnostic Results in last 30 days:  No results found.  Assessment/Plan 1. Tibia/fibula fracture, right, closed, initial encounter  Knee Immobilizer at all times to the Right leg. OK to remove for hygiene  Skin checks Q shift and prn  Refer to Orthopedist of Choice for evaluation and management  Pain control   2. Pain, acute due to trauma  Oxycodone 5 mg po TID scheduled  Oxycodone 5 mg- 1/2-1 tablet po Q 4 hours prn Continue complementary therapies including: Ice pack to site QID and prn Diversional activities Repositioning Q2 hours and prn Scheduled Tylenol 650 mg po QID  Family/ staff Communication:   Total Time:  Documentation:  Face to Face:  Family/Phone: Nursing and Palliative Care NP updated POA   Labs/tests ordered:  Complete view Xrays- Right knee, Tib/fib  Medication list reviewed and assessed for continued appropriateness.  Brynda Rim, NP-C Geriatrics [facility] [phone] [address], [place] [address] Cell Phone (Mon-Fri 8am-5pm):  [phone] On Call:  [phone] & follow prompts after 5pm & weekends Office Phone:  [phone] Office Fax:  [phone]

## 2016-09-03 ENCOUNTER — Encounter
Admission: RE | Admit: 2016-09-03 | Discharge: 2016-09-03 | Disposition: A | Discharge status: Home / Self Care | Payer: Medicare Other | Source: Ambulatory Visit | Attending: Internal Medicine | Admitting: Internal Medicine

## 2016-09-10 ENCOUNTER — Other Ambulatory Visit
Admission: RE | Admit: 2016-09-10 | Discharge: 2016-09-10 | Disposition: A | Discharge status: Home / Self Care | Payer: Medicare Other | Source: Ambulatory Visit | Attending: Gerontology | Admitting: Gerontology

## 2016-09-10 DIAGNOSIS — I10 Essential (primary) hypertension: Secondary | ICD-10-CM | POA: Insufficient documentation

## 2016-09-10 LAB — LIPID PANEL
Cholesterol: 171 mg/dL (ref 0–200)
HDL: 54 mg/dL (ref >40)
LDL CALC: 72 mg/dL (ref 0–99)
Total CHOL/HDL Ratio: 3.2 RATIO
Triglycerides: 223 mg/dL — ABNORMAL HIGH (ref <150)
VLDL: 45 mg/dL — AB (ref 0–40)

## 2016-09-10 LAB — CBC WITH DIFFERENTIAL/PLATELET
Basophils Absolute: 0.1 10*3/uL (ref 0–0.1)
Basophils Relative: 1 %
EOS ABS: 0.3 10*3/uL (ref 0–0.7)
EOS PCT: 3 %
HEMATOCRIT: 40.7 % (ref 35.0–47.0)
HEMOGLOBIN: 13.7 g/dL (ref 12.0–16.0)
LYMPHS ABS: 2.5 10*3/uL (ref 1.0–3.6)
LYMPHS PCT: 31 %
MCH: 31.9 pg (ref 26.0–34.0)
MCHC: 33.7 g/dL (ref 32.0–36.0)
MCV: 94.8 fL (ref 80.0–100.0)
MONO ABS: 0.7 10*3/uL (ref 0.2–0.9)
Monocytes Relative: 9 %
Neutro Abs: 4.6 10*3/uL (ref 1.4–6.5)
Neutrophils Relative %: 56 %
Platelets: 304 10*3/uL (ref 150–440)
RBC: 4.29 MIL/uL (ref 3.80–5.20)
RDW: 13.8 % (ref 11.5–14.5)
WBC: 8.2 10*3/uL (ref 3.6–11.0)

## 2016-09-10 LAB — MAGNESIUM: MAGNESIUM: 2.4 mg/dL (ref 1.7–2.4)

## 2016-09-10 LAB — COMPREHENSIVE METABOLIC PANEL
ALT: 28 U/L (ref 14–54)
ANION GAP: 10 (ref 5–15)
AST: 40 U/L (ref 15–41)
Albumin: 3.5 g/dL (ref 3.5–5.0)
Alkaline Phosphatase: 139 U/L — ABNORMAL HIGH (ref 38–126)
BUN: 26 mg/dL — ABNORMAL HIGH (ref 6–20)
CO2: 29 mmol/L (ref 22–32)
Calcium: 9.2 mg/dL (ref 8.9–10.3)
Chloride: 99 mmol/L — ABNORMAL LOW (ref 101–111)
Creatinine, Ser: 1.4 mg/dL — ABNORMAL HIGH (ref 0.44–1.00)
GFR, EST AFRICAN AMERICAN: 39 mL/min — AB (ref >60)
GFR, EST NON AFRICAN AMERICAN: 33 mL/min — AB (ref >60)
Glucose, Bld: 82 mg/dL (ref 65–99)
POTASSIUM: 4.2 mmol/L (ref 3.5–5.1)
Sodium: 138 mmol/L (ref 135–145)
TOTAL PROTEIN: 7.2 g/dL (ref 6.5–8.1)
Total Bilirubin: 0.7 mg/dL (ref 0.3–1.2)

## 2016-09-10 LAB — VITAMIN B12: VITAMIN B 12: 373 pg/mL (ref 180–914)

## 2016-09-11 LAB — VITAMIN D 25 HYDROXY (VIT D DEFICIENCY, FRACTURES): Vit D, 25-Hydroxy: 21.8 ng/mL — ABNORMAL LOW (ref 30.0–100.0)

## 2016-10-01 ENCOUNTER — Non-Acute Institutional Stay (SKILLED_NURSING_FACILITY): Payer: Medicare Other | Admitting: Gerontology

## 2016-10-01 DIAGNOSIS — I4891 Unspecified atrial fibrillation: Secondary | ICD-10-CM | POA: Diagnosis not present

## 2016-10-01 DIAGNOSIS — R42 Dizziness and giddiness: Secondary | ICD-10-CM

## 2016-10-01 DIAGNOSIS — E785 Hyperlipidemia, unspecified: Secondary | ICD-10-CM

## 2016-10-01 DIAGNOSIS — Z9981 Dependence on supplemental oxygen: Secondary | ICD-10-CM | POA: Diagnosis not present

## 2016-10-01 DIAGNOSIS — F419 Anxiety disorder, unspecified: Secondary | ICD-10-CM

## 2016-10-01 DIAGNOSIS — S82224D Nondisplaced transverse fracture of shaft of right tibia, subsequent encounter for closed fracture with routine healing: Secondary | ICD-10-CM

## 2016-10-01 DIAGNOSIS — E039 Hypothyroidism, unspecified: Secondary | ICD-10-CM | POA: Diagnosis not present

## 2016-10-01 DIAGNOSIS — F329 Major depressive disorder, single episode, unspecified: Secondary | ICD-10-CM | POA: Diagnosis not present

## 2016-10-01 DIAGNOSIS — K5900 Constipation, unspecified: Secondary | ICD-10-CM

## 2016-10-01 DIAGNOSIS — F0280 Dementia in other diseases classified elsewhere without behavioral disturbance: Secondary | ICD-10-CM | POA: Diagnosis not present

## 2016-10-01 DIAGNOSIS — J9610 Chronic respiratory failure, unspecified whether with hypoxia or hypercapnia: Secondary | ICD-10-CM | POA: Diagnosis not present

## 2016-10-01 DIAGNOSIS — E559 Vitamin D deficiency, unspecified: Secondary | ICD-10-CM

## 2016-10-01 DIAGNOSIS — I495 Sick sinus syndrome: Secondary | ICD-10-CM | POA: Diagnosis not present

## 2016-10-01 DIAGNOSIS — R451 Restlessness and agitation: Secondary | ICD-10-CM

## 2016-10-01 DIAGNOSIS — I10 Essential (primary) hypertension: Secondary | ICD-10-CM | POA: Diagnosis not present

## 2016-10-01 DIAGNOSIS — E538 Deficiency of other specified B group vitamins: Secondary | ICD-10-CM

## 2016-10-01 DIAGNOSIS — H409 Unspecified glaucoma: Secondary | ICD-10-CM

## 2016-10-03 DIAGNOSIS — I10 Essential (primary) hypertension: Secondary | ICD-10-CM | POA: Insufficient documentation

## 2016-10-03 DIAGNOSIS — E785 Hyperlipidemia, unspecified: Secondary | ICD-10-CM | POA: Insufficient documentation

## 2016-10-03 DIAGNOSIS — F419 Anxiety disorder, unspecified: Secondary | ICD-10-CM | POA: Insufficient documentation

## 2016-10-03 DIAGNOSIS — S82224D Nondisplaced transverse fracture of shaft of right tibia, subsequent encounter for closed fracture with routine healing: Secondary | ICD-10-CM | POA: Insufficient documentation

## 2016-10-03 DIAGNOSIS — F329 Major depressive disorder, single episode, unspecified: Secondary | ICD-10-CM | POA: Insufficient documentation

## 2016-10-03 DIAGNOSIS — H409 Unspecified glaucoma: Secondary | ICD-10-CM | POA: Insufficient documentation

## 2016-10-03 DIAGNOSIS — E039 Hypothyroidism, unspecified: Secondary | ICD-10-CM | POA: Insufficient documentation

## 2016-10-03 DIAGNOSIS — I495 Sick sinus syndrome: Secondary | ICD-10-CM | POA: Insufficient documentation

## 2016-10-03 DIAGNOSIS — R42 Dizziness and giddiness: Secondary | ICD-10-CM | POA: Insufficient documentation

## 2016-10-03 DIAGNOSIS — R451 Restlessness and agitation: Secondary | ICD-10-CM | POA: Insufficient documentation

## 2016-10-03 DIAGNOSIS — Z9981 Dependence on supplemental oxygen: Secondary | ICD-10-CM | POA: Insufficient documentation

## 2016-10-03 NOTE — Progress Notes (Signed)
Location:      Place of Service:  SNF (31) Provider:  Lorenso Quarry, NP-C  Lorenso Quarry, NP  Patient Care Team: Lorenso Quarry, NP as PCP - General (Family Medicine)  Extended Emergency Contact Information Primary Emergency Contact: Emilie Rutter of [place] Home Phone: [phone] Mobile Phone: [phone] Relation: Other Secondary Emergency Contact: Buchanan,Brad Address: [address]          [place], [place] [address] [place] Home Phone: [phone] Work Phone: [phone] Relation: Other  Code Status:  DNR Goals of care: Advanced Directive information No flowsheet data found.   Chief Complaint  Patient presents with  . Medical Management of Chronic Issues    HPI:  Pt is a 83 y.o. female seen today for medical management of chronic diseases. Pt has been stable this past month. No new falls reported. Pt has been compliant with the Knee Immobilizer and has been to see Orthopedics. Pt reports pain is well controlled and is looking forward to being able to get the knee immobilizer off. Pt reports her depression and anxiety have improved. Pt is being followed by [facility]. Pt reports her appetite is good and she is having regular BMs. Resident doesn't tend to socialize much with the other residents. Pt denies n/v/d/f/c/cp/sob/ha/abd pain/dizziness/cough. VSS. No other complaints.       No past medical history on file. No past surgical history on file.  Allergies not on file  Allergies as of 10/01/2016   Not on File     Medication List    as of 10/01/2016 11:59 PM   You have not been prescribed any medications.     Review of Systems  Constitutional: Negative for activity change, appetite change, chills, diaphoresis and fever.  HENT: Negative for congestion, sneezing, sore throat, trouble swallowing and voice change.   Respiratory: Negative for apnea, cough, choking, chest tightness, shortness of breath and  wheezing.   Cardiovascular: Negative for chest pain, palpitations and leg swelling.  Gastrointestinal: Negative for abdominal distention, abdominal pain, constipation, diarrhea and nausea.  Genitourinary: Negative for difficulty urinating, dysuria, frequency and urgency.  Musculoskeletal: Positive for arthralgias (typical arthritis) and joint swelling. Negative for back pain, gait problem and myalgias.  Skin: Negative for color change, pallor, rash and wound.  Neurological: Negative for dizziness, tremors, syncope, speech difficulty, weakness, numbness and headaches.  Psychiatric/Behavioral: Negative for agitation and behavioral problems.  All other systems reviewed and are negative.    There is no immunization history on file for this patient. Pertinent  Health Maintenance Due  Topic Date Due  . DEXA SCAN  12/21/1995  . PNA vac Low Risk Adult (1 of 2 - PCV13) 12/21/1995  . INFLUENZA VACCINE  12/04/2016   No flowsheet data found. Functional Status Survey:    Vitals:   09/30/16 1030  BP: (!) 113/49  Pulse: 66  Resp: 18  Temp: 97.4 F (36.3 C)  SpO2: 99%   There is no height or weight on file to calculate BMI. Physical Exam  Constitutional: Vital signs are normal. She appears well-developed and well-nourished. She is active and cooperative. She does not appear ill. No distress.  HENT:  Head: Normocephalic and atraumatic.  Mouth/Throat: Uvula is midline, oropharynx is clear and moist and mucous membranes are normal. Mucous membranes are not pale, not dry and not cyanotic.  Eyes: Conjunctivae, EOM and lids are normal. Pupils are equal, round, and reactive to light.  Neck: Trachea normal, normal range of  motion and full passive range of motion without pain. Neck supple. No JVD present. No tracheal deviation, no edema and no erythema present. No thyromegaly present.  Cardiovascular: Normal rate, regular rhythm, normal heart sounds, intact distal pulses and normal pulses.  Exam  reveals no gallop, no distant heart sounds and no friction rub.   No murmur heard. Pulses:      Dorsalis pedis pulses are 2+ on the right side, and 2+ on the left side.  Pulmonary/Chest: Effort normal and breath sounds normal. No accessory muscle usage. No respiratory distress. She has no decreased breath sounds. She has no wheezes. She has no rhonchi. She has no rales. She exhibits no tenderness.  Abdominal: Normal appearance and bowel sounds are normal. She exhibits no distension and no ascites. There is no tenderness.  Musculoskeletal: She exhibits no edema.       Right knee: She exhibits decreased range of motion and swelling. Tenderness found.  Expected osteoarthritis, stiffness; calves soft, supple. Negative Homan's sign  Neurological: She is alert. She has normal strength. She is disoriented.  confused  Skin: Skin is warm and dry. Abrasion and bruising (healing- proximal tibial) noted. No rash noted. She is not diaphoretic. No cyanosis or erythema. No pallor. Nails show no clubbing.     Psychiatric: She has a normal mood and affect. Her speech is normal and behavior is normal. Judgment and thought content normal. Cognition and memory are normal.  Nursing note and vitals reviewed.   Labs reviewed:  Recent Labs  06/18/16 0750 09/10/16 0550  NA 142 138  K 3.0* 4.2  CL 104 99*  CO2 31 29  GLUCOSE 97 82  BUN 15 26*  CREATININE 1.09* 1.40*  CALCIUM 8.6* 9.2  MG  --  2.4    Recent Labs  06/18/16 0750 09/10/16 0550  AST 60* 40  ALT 57* 28  ALKPHOS 71 139*  BILITOT 0.4 0.7  PROT 6.9 7.2  ALBUMIN 3.2* 3.5    Recent Labs  06/18/16 0750 09/10/16 0550  WBC 6.7 8.2  NEUTROABS 3.4 4.6  HGB 13.7 13.7  HCT 40.5 40.7  MCV 93.0 94.8  PLT 241 304   Lab Results  Component Value Date   TSH 1.184 06/18/2016   Lab Results  Component Value Date   HGBA1C 6.9 (H) 02/09/2014   Lab Results  Component Value Date   CHOL 171 09/10/2016   HDL 54 09/10/2016   LDLCALC 72  09/10/2016   TRIG 223 (H) 09/10/2016   CHOLHDL 3.2 09/10/2016    Significant Diagnostic Results in last 30 days:  No results found.  Assessment/Plan 1. Chronic respiratory failure, unsp w hypoxia or hypercapnia (HCC)  Stable  Continue Duo-Nebs BID  2. Dependence on supplemental oxygen  Stable  O2 3 L [place] at all times  3. Hypothyroidism, unspecified type  Stable  Continue Synthroid 137 mcg po Q Day  4. Dementia in other diseases classified elsewhere without behavioral disturbance  Stable  Continue Namenda 10 mg po BID  5. Glaucoma of both eyes, unspecified glaucoma type  Stable  Continue Travatan Z 0.004%- 1 drop in both eyes Q HS  6. Dizziness and giddiness  Stable  7. Essential (primary) hypertension  Stable  Continue Lasix 40 mg po Q Day  Continue Potassium Chloride 20 meq po Q Day  Continue Lisinopril 5 mg po Q Day  8. Major depressive disorder with single episode, remission status unspecified  Stable  Continue Venlafaxine ER 75 mg po Q Day  9. Anxiety disorder, unspecified type  Stable  PRN Alprazolam d/c'ed  10. Hyperlipidemia, unspecified hyperlipidemia type  Stable  Continue Lovastatin 20 mg po Q Day  11. Atrial fibrillation with tachycardic ventricular rate (HCC)  Stable  Continue Amiodarone 200 mg po Q Day  12. Sick sinus syndrome (HCC)  Stable  Continue ASA 81 mg po Q Day  13. Nondisplaced transverse fracture of shaft of right tibia, subsequent encounter for closed fracture with routine healing  Stable  Continue Acetaminophen 650 mg po QID  Continue Oxycodone 5 mg po TID scheduled, and   Continue Oxycodone 5 mg- 1/2-1 tablet po Q 4 hours prn breakthrough pain  Continue NWB RLE  Knee Immobilizer at all times except for hygiene  Allevyn patch to area of redness beneath immobilizer for padding. Change PRN  14. Restlessness and agitation  Stable  15. Vitamin D Deficiency  Stable  Continue  Certavite-Antioxidant 1 tablet po Q Day  Continue Cholecalciferol 2,000 units po Q Day  16. Vitamin B12 Deficiency  Stable  Continue Cyanocobalamin 1,000 mcg po Q Day  17. Constipation  Stable  Continue Colace 100 mg po BID  Continue Miralax 17 g po Q Monday, Wednesday and Friday   Continue Peridex 0.12%- 15 mL swish and spit Q HS for oral health   Family/ staff Communication:   Total Time:  Documentation:  Face to Face:  Family/Phone:   Labs/tests ordered:    Medication list reviewed and assessed for continued appropriateness. Monthly medication orders reviewed and signed.  Brynda RimShannon H. Gabrielly Mccrystal, NP-C Geriatrics [facility] [phone] [place], [place] [address] Cell Phone (Mon-Fri 8am-5pm):  [phone] On Call:  [phone] & follow prompts after 5pm & weekends Office Phone:  [phone] Office Fax:  [phone]

## 2016-10-04 ENCOUNTER — Encounter
Admission: RE | Admit: 2016-10-04 | Discharge: 2016-10-04 | Disposition: A | Discharge status: Home / Self Care | Payer: Medicare Other | Source: Ambulatory Visit | Attending: Internal Medicine | Admitting: Internal Medicine

## 2016-10-16 ENCOUNTER — Non-Acute Institutional Stay (SKILLED_NURSING_FACILITY): Payer: Medicare Other | Admitting: Gerontology

## 2016-10-16 DIAGNOSIS — H109 Unspecified conjunctivitis: Secondary | ICD-10-CM

## 2016-11-03 ENCOUNTER — Encounter
Admission: RE | Admit: 2016-11-03 | Discharge: 2016-11-03 | Disposition: A | Discharge status: Home / Self Care | Payer: Medicare Other | Source: Ambulatory Visit | Attending: Internal Medicine | Admitting: Internal Medicine

## 2016-11-08 NOTE — Progress Notes (Signed)
Location:      Place of Service:  SNF (31) Provider:  Lorenso QuarryShannon Anelle Parlow, NP-C  Lorenso QuarryLeach, Avenly Roberge, NP  Patient Care Team: Lorenso QuarryLeach, Osmany Azer, NP as PCP - General (Family Medicine)  Extended Emergency Contact Information Primary Emergency Contact: Emilie Rutteriley,Angela  [place] Home Phone: [phone] Mobile Phone: [phone] Relation: Other Secondary Emergency Contact: Buchanan,Brad Address: [address]          [place], [place] [address] [place] Home Phone: [phone] Work Phone: [phone] Relation: Other  Code Status:  DNR Goals of care: Advanced Directive information No flowsheet data found.   Chief Complaint  Patient presents with  . Acute Visit    HPI:  Pt is a 83 y.o. female seen today for an acute visit for eye drainage. Right eye with thick, green discharge. Conjunctiva injected. Erythema. Eye watery/moist. Pt reports minimal pain. Denies itching. Denies trauma to the eye. No changes in vision. Cornea clear. No other complaints.    No past medical history on file. No past surgical history on file.  Allergies not on file  Allergies as of 10/16/2016   Not on File     Medication List    as of 10/16/2016 11:59 PM   You have not been prescribed any medications.     Review of Systems  Constitutional: Negative for activity change, appetite change, chills, diaphoresis, fatigue and fever.  HENT: Negative for congestion, facial swelling, nosebleeds, postnasal drip, rhinorrhea, sinus pain, sinus pressure, sneezing, sore throat and tinnitus.   Eyes: Positive for discharge and redness. Negative for photophobia, pain, itching and visual disturbance.  Respiratory: Negative.   Cardiovascular: Negative.   Gastrointestinal: Negative.   Genitourinary: Negative.   Musculoskeletal: Negative.   Skin: Negative.   Neurological: Negative for dizziness, syncope, weakness, light-headedness and headaches.  Hematological: Negative.     Psychiatric/Behavioral: Negative.      There is no immunization history on file for this patient. Pertinent  Health Maintenance Due  Topic Date Due  . DEXA SCAN  12/21/1995  . PNA vac Low Risk Adult (1 of 2 - PCV13) 12/21/1995  . INFLUENZA VACCINE  12/04/2016   No flowsheet data found. Functional Status Survey:    Vitals:   10/14/16 1500  BP: (!) 115/41  Pulse: (!) 54  SpO2: 96%   There is no height or weight on file to calculate BMI. Physical Exam  Constitutional: She appears well-developed and well-nourished. No distress.  HENT:  Head: Normocephalic and atraumatic.  Eyes: Pupils are equal, round, and reactive to light. Right eye exhibits discharge. No foreign body present in the right eye. Right conjunctiva is injected. Right eye exhibits normal extraocular motion and no nystagmus.  Pulmonary/Chest: Effort normal.  Skin: She is not diaphoretic.  Psychiatric: She has a normal mood and affect. Her speech is normal and behavior is normal. Judgment and thought content normal. Cognition and memory are normal.    Labs reviewed:  Recent Labs  06/18/16 0750 09/10/16 0550  NA 142 138  K 3.0* 4.2  CL 104 99*  CO2 31 29  GLUCOSE 97 82  BUN 15 26*  CREATININE 1.09* 1.40*  CALCIUM 8.6* 9.2  MG  --  2.4    Recent Labs  06/18/16 0750 09/10/16 0550  AST 60* 40  ALT 57* 28  ALKPHOS 71 139*  BILITOT 0.4 0.7  PROT 6.9 7.2  ALBUMIN 3.2* 3.5    Recent Labs  06/18/16 0750 09/10/16 0550  WBC 6.7 8.2  NEUTROABS 3.4 4.6  HGB 13.7 13.7  HCT 40.5 40.7  MCV 93.0 94.8  PLT 241 304   Lab Results  Component Value Date   TSH 1.184 06/18/2016   Lab Results  Component Value Date   HGBA1C 6.9 (H) 02/09/2014   Lab Results  Component Value Date   CHOL 171 09/10/2016   HDL 54 09/10/2016   LDLCALC 72 09/10/2016   TRIG 223 (H) 09/10/2016   CHOLHDL 3.2 09/10/2016    Significant Diagnostic Results in last 30 days:  No results found.  Assessment/Plan 1. Bacterial  conjunctivitis of both eyes  Polytrim (polymyxin b sulf-trimethoprim) drops; 10,000 unit- 1 mg/mL; amt: 1 drop in each eye; ophthalmic (eye) QID x 7 days  Warm, moist compresses to the eye PRN  Family/ staff Communication:   Total Time:  Documentation:  Face to Face:  Family/Phone:   Labs/tests ordered:    Medication list reviewed and assessed for continued appropriateness.  Brynda Rim, NP-C Geriatrics [facility] [phone] [address], [place] [address] Cell Phone (Mon-Fri 8am-5pm):  [phone] On Call:  [phone] & follow prompts after 5pm & weekends Office Phone:  [phone] Office Fax:  [phone]

## 2016-11-14 ENCOUNTER — Non-Acute Institutional Stay (SKILLED_NURSING_FACILITY): Payer: Medicare Other | Admitting: Gerontology

## 2016-11-14 DIAGNOSIS — I10 Essential (primary) hypertension: Secondary | ICD-10-CM

## 2016-11-14 DIAGNOSIS — H409 Unspecified glaucoma: Secondary | ICD-10-CM | POA: Diagnosis not present

## 2016-11-14 DIAGNOSIS — I4891 Unspecified atrial fibrillation: Secondary | ICD-10-CM

## 2016-11-14 DIAGNOSIS — J9610 Chronic respiratory failure, unspecified whether with hypoxia or hypercapnia: Secondary | ICD-10-CM

## 2016-11-14 DIAGNOSIS — F329 Major depressive disorder, single episode, unspecified: Secondary | ICD-10-CM

## 2016-11-14 DIAGNOSIS — E785 Hyperlipidemia, unspecified: Secondary | ICD-10-CM

## 2016-11-14 DIAGNOSIS — E039 Hypothyroidism, unspecified: Secondary | ICD-10-CM | POA: Diagnosis not present

## 2016-11-14 DIAGNOSIS — E538 Deficiency of other specified B group vitamins: Secondary | ICD-10-CM

## 2016-11-14 DIAGNOSIS — Z9981 Dependence on supplemental oxygen: Secondary | ICD-10-CM | POA: Diagnosis not present

## 2016-11-14 DIAGNOSIS — K5900 Constipation, unspecified: Secondary | ICD-10-CM

## 2016-11-14 DIAGNOSIS — R451 Restlessness and agitation: Secondary | ICD-10-CM

## 2016-11-14 DIAGNOSIS — F0280 Dementia in other diseases classified elsewhere without behavioral disturbance: Secondary | ICD-10-CM | POA: Diagnosis not present

## 2016-11-14 DIAGNOSIS — S82224D Nondisplaced transverse fracture of shaft of right tibia, subsequent encounter for closed fracture with routine healing: Secondary | ICD-10-CM

## 2016-11-14 DIAGNOSIS — E559 Vitamin D deficiency, unspecified: Secondary | ICD-10-CM

## 2016-11-14 DIAGNOSIS — F419 Anxiety disorder, unspecified: Secondary | ICD-10-CM

## 2016-11-14 DIAGNOSIS — R42 Dizziness and giddiness: Secondary | ICD-10-CM

## 2016-11-14 DIAGNOSIS — I495 Sick sinus syndrome: Secondary | ICD-10-CM | POA: Diagnosis not present

## 2016-11-15 ENCOUNTER — Encounter: Payer: Self-pay | Admitting: Gerontology

## 2016-11-15 NOTE — Progress Notes (Signed)
Opened in error; Disregard.

## 2016-11-15 NOTE — Progress Notes (Signed)
Location:   [facility] Room Number: 313B Place of Service:  SNF [phone]) Provider:  Lorenso Quarry, NP-C  Lorenso Quarry, NP  Patient Care Team: Lorenso Quarry, NP as PCP - General (Family Medicine)  Extended Emergency Contact Information Primary Emergency Contact: Emilie Rutter of [place] Home Phone: [phone] Mobile Phone: [phone] Relation: Other Secondary Emergency Contact: Buchanan,Brad Address: [address]          [place], [place] [address] [place] Home Phone: [phone] Work Phone: [phone] Relation: Other  Code Status:  DNR Goals of care: Advanced Directive information Advanced Directives 11/14/2016  Does Patient Have a Medical Advance Directive? Yes  Type of Advance Directive Out of facility DNR (pink MOST or yellow form)  Does patient want to make changes to medical advance directive? No - Patient declined     Chief Complaint  Patient presents with  . Medical Management of Chronic Issues    Routine Visit    HPI:  Pt is a 83 y.o. female seen today for medical management of chronic diseases. Pt has been stable this past month. No new falls reported. Pt has been compliant with the Knee Immobilizer and has been to see Orthopedics. Pt reports pain is well controlled and is looking forward to being able to get the knee immobilizer off. Pt reports her depression and anxiety have improved. However, staff report she has frequent episodes of sobbing and uncontrolled crying. Pt is being followed by [facility]. Pt reports her appetite is good and she is having regular BMs. Resident doesn't tend to socialize much with the other residents. Pt denies n/v/d/f/c/cp/sob/ha/abd pain/dizziness/cough. VSS. No other complaints.     Past Medical History:  Diagnosis Date  . Hyperlipidemia, unspecified   . Stroke [facility])    with partial right hemiparesis  . Thyroid disease    Past Surgical History:    Procedure Laterality Date  . APPENDECTOMY      Allergies  Allergen Reactions  . Penicillin G     Other reaction(s): Unknown  . Penicillins     Allergies as of 11/14/2016   Not on File     Medication List       Accurate as of 11/14/16 11:59 PM. Always use your most recent med list.          acetaminophen 325 MG tablet Commonly known as:  TYLENOL Take 650 mg by mouth every 6 (six) hours.   ALPRAZolam 0.25 MG tablet Commonly known as:  XANAX Take 0.25 mg by mouth every 8 (eight) hours as needed for anxiety (agitation).   amiodarone 200 MG tablet Commonly known as:  PACERONE Take 200 mg by mouth daily.   aspirin 81 MG chewable tablet Chew 81 mg by mouth daily.   CERTAVITE/ANTIOXIDANTS Tabs Take 1 tablet by mouth daily.   chlorhexidine 0.12 % solution Commonly known as:  PERIDEX Use as directed 15 mLs in the mouth or throat at bedtime. Rinse and spit out at bedtime after brushing   cyanocobalamin 1000 MCG tablet Take 1,000 mcg by mouth daily.   DERMACLOUD Crea Apply liberal amount topically  to area of skin irritation as needed. Ok to leave at bedside.   docusate sodium 100 MG capsule Commonly known as:  COLACE Take 100 mg by mouth 2 (two) times daily.   furosemide 40 MG tablet Commonly known as:  LASIX Take 40 mg by mouth daily.   levothyroxine 137 MCG tablet Commonly known  as:  SYNTHROID, LEVOTHROID Take 137 mcg by mouth daily before breakfast.   lisinopril 5 MG tablet Commonly known as:  PRINIVIL,ZESTRIL Take 5 mg by mouth daily.   lovastatin 20 MG tablet Commonly known as:  MEVACOR Take 20 mg by mouth at bedtime.   memantine 5 MG tablet Commonly known as:  NAMENDA Take 5 mg by mouth 2 (two) times daily.   ondansetron 8 MG tablet Commonly known as:  ZOFRAN Take 8 mg by mouth 2 (two) times daily.   oxycodone 5 MG capsule Commonly known as:  OXY-IR Take 5 mg by mouth 3 (three) times daily. Ok to give prn doses in addition to scheduled    oxycodone 5 MG capsule Commonly known as:  OXY-IR Take 2.5-5 mg by mouth every 4 (four) hours as needed for pain. (1/2 (2.5 mg) tablet for moderate pain, 1 tablet (5 mg) for severe pain   OXYGEN Inhale 3 L into the lungs continuous.   polyethylene glycol packet Commonly known as:  MIRALAX / GLYCOLAX Take 17 g by mouth See admin instructions. On Mon, Wed, Frid for constipation prevention   potassium chloride SA 20 MEQ tablet Commonly known as:  K-DUR,KLOR-CON Take 20 mEq by mouth daily.   travoprost (benzalkonium) 0.004 % ophthalmic solution Commonly known as:  TRAVATAN Place 1 drop into both eyes at bedtime.   venlafaxine XR 75 MG 24 hr capsule Commonly known as:  EFFEXOR-XR Take 75 mg by mouth daily with breakfast.   Vitamin D3 2000 units capsule Take 2,000 Units by mouth daily.       Review of Systems  Constitutional: Negative for activity change, appetite change, chills, diaphoresis and fever.  HENT: Negative for congestion, sneezing, sore throat, trouble swallowing and voice change.   Respiratory: Negative for apnea, cough, choking, chest tightness, shortness of breath and wheezing.   Cardiovascular: Negative for chest pain, palpitations and leg swelling.  Gastrointestinal: Negative for abdominal distention, abdominal pain, constipation, diarrhea and nausea.  Genitourinary: Negative for difficulty urinating, dysuria, frequency and urgency.  Musculoskeletal: Positive for arthralgias (typical arthritis) and joint swelling. Negative for back pain, gait problem and myalgias.  Skin: Negative for color change, pallor, rash and wound.  Neurological: Negative for dizziness, tremors, syncope, speech difficulty, weakness, numbness and headaches.  Psychiatric/Behavioral: Negative for agitation and behavioral problems. The patient is nervous/anxious.   All other systems reviewed and are negative.    There is no immunization history on file for this patient. Pertinent  Health  Maintenance Due  Topic Date Due  . DEXA SCAN  12/21/1995  . PNA vac Low Risk Adult (1 of 2 - PCV13) 12/21/1995  . INFLUENZA VACCINE  12/04/2016   No flowsheet data found. Functional Status Survey:    Vitals:   11/14/16 1210  BP: 100/83  Pulse: 81  Resp: 18  Temp: 98.5 F (36.9 C)  SpO2: 95%  Weight: 158 lb (71.7 kg)  Height: 5' 4.02" (1.626 m)   Body mass index is 27.11 kg/m. Physical Exam  Constitutional: Vital signs are normal. She appears well-developed and well-nourished. She is active and cooperative. She does not appear ill. No distress.  HENT:  Head: Normocephalic and atraumatic.  Mouth/Throat: Uvula is midline, oropharynx is clear and moist and mucous membranes are normal. Mucous membranes are not pale, not dry and not cyanotic.  Eyes: Pupils are equal, round, and reactive to light. Conjunctivae, EOM and lids are normal.  Neck: Trachea normal, normal range of motion and full passive range of  motion without pain. Neck supple. No JVD present. No tracheal deviation, no edema and no erythema present. No thyromegaly present.  Cardiovascular: Normal rate, regular rhythm, normal heart sounds, intact distal pulses and normal pulses.  Exam reveals no gallop, no distant heart sounds and no friction rub.   No murmur heard. Pulses:      Dorsalis pedis pulses are 2+ on the right side, and 2+ on the left side.  Pulmonary/Chest: Effort normal and breath sounds normal. No accessory muscle usage. No respiratory distress. She has no decreased breath sounds. She has no wheezes. She has no rhonchi. She has no rales. She exhibits no tenderness.  Abdominal: Normal appearance and bowel sounds are normal. She exhibits no distension and no ascites. There is no tenderness.  Musculoskeletal: She exhibits no edema.       Right knee: She exhibits decreased range of motion and swelling. Tenderness found.  Expected osteoarthritis, stiffness; calves soft, supple. Negative Homan's sign  Neurological:  She is alert. She has normal strength. She is disoriented.  confused  Skin: Skin is warm and dry. Abrasion and bruising (healing- proximal tibial) noted. No rash noted. She is not diaphoretic. No cyanosis or erythema. No pallor. Nails show no clubbing.     Psychiatric: She has a normal mood and affect. Her speech is normal and behavior is normal. Judgment and thought content normal. Cognition and memory are normal.  Nursing note and vitals reviewed.   Labs reviewed:  Recent Labs  06/18/16 0750 09/10/16 0550  NA 142 138  K 3.0* 4.2  CL 104 99*  CO2 31 29  GLUCOSE 97 82  BUN 15 26*  CREATININE 1.09* 1.40*  CALCIUM 8.6* 9.2  MG  --  2.4    Recent Labs  06/18/16 0750 09/10/16 0550  AST 60* 40  ALT 57* 28  ALKPHOS 71 139*  BILITOT 0.4 0.7  PROT 6.9 7.2  ALBUMIN 3.2* 3.5    Recent Labs  06/18/16 0750 09/10/16 0550  WBC 6.7 8.2  NEUTROABS 3.4 4.6  HGB 13.7 13.7  HCT 40.5 40.7  MCV 93.0 94.8  PLT 241 304   Lab Results  Component Value Date   TSH 1.184 06/18/2016   Lab Results  Component Value Date   HGBA1C 6.9 (H) 02/09/2014   Lab Results  Component Value Date   CHOL 171 09/10/2016   HDL 54 09/10/2016   LDLCALC 72 09/10/2016   TRIG 223 (H) 09/10/2016   CHOLHDL 3.2 09/10/2016    Significant Diagnostic Results in last 30 days:  No results found.  Assessment/Plan 1. Chronic respiratory failure, unsp w hypoxia or hypercapnia (HCC)  Stable  Continue Duo-Nebs BID  2. Dependence on supplemental oxygen  Stable  O2 3 L [place] at all times  3. Hypothyroidism, unspecified type  Stable  Continue Synthroid 137 mcg po Q Day  4. Dementia in other diseases classified elsewhere without behavioral disturbance  Stable  Continue Namenda 5 mg po BID  5. Glaucoma of both eyes, unspecified glaucoma type  Stable  Continue Travatan Z 0.004%- 1 drop in both eyes Q HS  6. Dizziness and giddiness  Stable  7. Essential (primary)  hypertension  Stable  Continue Lasix 40 mg po Q Day  Continue Potassium Chloride 20 meq po Q Day  Continue Lisinopril 5 mg po Q Day  8. Major depressive disorder with single episode, remission status unspecified  Stable  Increase Venlafaxine ER 150 mg po Q Day  9. Anxiety disorder, unspecified type  Stable  PRN Alprazolam reordered  Clonazepam 0.5 mg po BID  10. Hyperlipidemia, unspecified hyperlipidemia type  Stable  Continue Lovastatin 20 mg po Q Day  11. Atrial fibrillation with tachycardic ventricular rate (HCC)  Stable  Continue Amiodarone 200 mg po Q Day  12. Sick sinus syndrome (HCC)  Stable  Continue ASA 81 mg po Q Day  13. Nondisplaced transverse fracture of shaft of right tibia, subsequent encounter for closed fracture with routine healing  Stable  Continue Acetaminophen 650 mg po QID  Continue Oxycodone 5 mg po TID scheduled, and   Continue Oxycodone 5 mg- 1/2-1 tablet po Q 4 hours prn breakthrough pain  Continue NWB RLE  14. Restlessness and agitation  Stable  15. Vitamin D Deficiency  Stable  Continue Certavite-Antioxidant 1 tablet po Q Day  Continue Cholecalciferol 2,000 units po Q Day  16. Vitamin B12 Deficiency  Stable  Continue Cyanocobalamin 1,000 mcg po Q Day  17. Constipation  Stable  Continue Colace 100 mg po BID  Continue Miralax 17 g po Q Monday, Wednesday and Friday   Continue Peridex 0.12%- 15 mL swish and spit Q HS for oral health   Family/ staff Communication:   Total Time:  Documentation:  Face to Face:  Family/Phone:   Labs/tests ordered:    Medication list reviewed and assessed for continued appropriateness. Monthly medication orders reviewed and signed.  Brynda RimShannon H. Roderic Lammert, NP-C Geriatrics [facility] [phone] [place], [place] [address] Cell Phone (Mon-Fri 8am-5pm):  [phone] On Call:  [phone] & follow prompts after 5pm & weekends Office  Phone:  [phone] Office Fax:  [phone]

## 2016-11-28 ENCOUNTER — Other Ambulatory Visit: Payer: Self-pay

## 2016-11-28 MED ORDER — OXYCODONE HCL 5 MG PO TABS: 5.0000 mg | ORAL_TABLET | Freq: Three times a day (TID) | ORAL | 0 refills | Status: DC

## 2016-11-28 NOTE — Telephone Encounter (Signed)
Rx sent to [facility] phone : [phone] , fax : [phone]  

## 2016-12-04 ENCOUNTER — Encounter
Admission: RE | Admit: 2016-12-04 | Discharge: 2016-12-04 | Disposition: A | Discharge status: Home / Self Care | Payer: Medicare Other | Source: Ambulatory Visit | Attending: Internal Medicine | Admitting: Internal Medicine

## 2016-12-09 ENCOUNTER — Other Ambulatory Visit: Payer: Self-pay

## 2016-12-09 MED ORDER — OXYCODONE HCL 5 MG PO CAPS: ORAL_CAPSULE | ORAL | 0 refills | Status: DC

## 2016-12-09 NOTE — Telephone Encounter (Signed)
Rx sent to [facility] phone : [phone] , fax : [phone]  

## 2016-12-16 ENCOUNTER — Non-Acute Institutional Stay (SKILLED_NURSING_FACILITY): Payer: Medicare Other | Admitting: Gerontology

## 2016-12-16 ENCOUNTER — Encounter: Payer: Self-pay | Admitting: Gerontology

## 2016-12-16 DIAGNOSIS — K1121 Acute sialoadenitis: Secondary | ICD-10-CM

## 2016-12-16 DIAGNOSIS — E039 Hypothyroidism, unspecified: Secondary | ICD-10-CM

## 2016-12-16 DIAGNOSIS — E785 Hyperlipidemia, unspecified: Secondary | ICD-10-CM | POA: Diagnosis not present

## 2016-12-16 DIAGNOSIS — K5900 Constipation, unspecified: Secondary | ICD-10-CM

## 2016-12-16 DIAGNOSIS — S82224D Nondisplaced transverse fracture of shaft of right tibia, subsequent encounter for closed fracture with routine healing: Secondary | ICD-10-CM

## 2016-12-16 DIAGNOSIS — E538 Deficiency of other specified B group vitamins: Secondary | ICD-10-CM

## 2016-12-16 DIAGNOSIS — F419 Anxiety disorder, unspecified: Secondary | ICD-10-CM | POA: Diagnosis not present

## 2016-12-16 DIAGNOSIS — Z9981 Dependence on supplemental oxygen: Secondary | ICD-10-CM

## 2016-12-16 DIAGNOSIS — I495 Sick sinus syndrome: Secondary | ICD-10-CM

## 2016-12-16 DIAGNOSIS — F0280 Dementia in other diseases classified elsewhere without behavioral disturbance: Secondary | ICD-10-CM

## 2016-12-16 DIAGNOSIS — I4891 Unspecified atrial fibrillation: Secondary | ICD-10-CM

## 2016-12-16 DIAGNOSIS — E559 Vitamin D deficiency, unspecified: Secondary | ICD-10-CM

## 2016-12-16 DIAGNOSIS — I10 Essential (primary) hypertension: Secondary | ICD-10-CM | POA: Diagnosis not present

## 2016-12-16 DIAGNOSIS — R42 Dizziness and giddiness: Secondary | ICD-10-CM

## 2016-12-16 DIAGNOSIS — F329 Major depressive disorder, single episode, unspecified: Secondary | ICD-10-CM

## 2016-12-16 DIAGNOSIS — J9610 Chronic respiratory failure, unspecified whether with hypoxia or hypercapnia: Secondary | ICD-10-CM

## 2016-12-16 DIAGNOSIS — H409 Unspecified glaucoma: Secondary | ICD-10-CM

## 2016-12-16 DIAGNOSIS — R451 Restlessness and agitation: Secondary | ICD-10-CM

## 2016-12-16 NOTE — Progress Notes (Signed)
Location:   [facility] Room Number: 313B Place of Service:  SNF [phone]) Provider:  Lorenso Quarry, NP-C  Lorenso Quarry, NP  Patient Care Team: Lorenso Quarry, NP as PCP - General (Family Medicine)  Extended Emergency Contact Information Primary Emergency Contact: Emilie Rutter of [place] Home Phone: [phone] Mobile Phone: [phone] Relation: Other Secondary Emergency Contact: Buchanan,Brad Address: [address]          [place], [place] [address] [place] Home Phone: [phone] Work Phone: [phone] Relation: Other  Code Status:  DNR Goals of care: Advanced Directive information Advanced Directives 12/16/2016  Does Patient Have a Medical Advance Directive? Yes  Type of Advance Directive Out of facility DNR (pink MOST or yellow form)  Does patient want to make changes to medical advance directive? No - Patient declined     Chief Complaint  Patient presents with  . Medical Management of Chronic Issues    Routine Visit    HPI:  Pt is a 83 y.o. female seen today for medical management of chronic diseases, as well as acute visit to evaluate Parotidis. Pt has been stable this past month. No new falls reported. Pt reports her depression and anxiety have improved. However, staff report she has frequent episodes of sobbing and uncontrolled crying. Pt is being followed by [facility]. Staff reports her appetite is good and she is having regular BMs. Resident doesn't tend to socialize much with the other residents. Staff denies pt c/o n/v/d/f/c/cp/sob/ha/abd pain/dizziness/cough. Pt observed sitting in [place] in the commons area. Pt was obtunded, pale, diaphoretic- but VSS, even respirations. Will adjust behavior medications. Also, pt with red, warm, swollen painful right jaw. Area is hardened below the TMJ. Low grade temp. Started yesterday. VSS. No other complaints.     Past Medical History:    Diagnosis Date  . Hyperlipidemia, unspecified   . Stroke [facility])    with partial right hemiparesis  . Thyroid disease    Past Surgical History:  Procedure Laterality Date  . APPENDECTOMY      Allergies  Allergen Reactions  . Penicillin G     Other reaction(s): Unknown  . Penicillins     Allergies as of 12/16/2016      Reactions   Penicillin G    Other reaction(s): Unknown   Penicillins       Medication List       Accurate as of 12/16/16  3:26 PM. Always use your most recent med list.          acetaminophen 325 MG tablet Commonly known as:  TYLENOL Take 650 mg by mouth 4 (four) times daily.   ALPRAZolam 0.25 MG tablet Commonly known as:  XANAX Take 0.25 mg by mouth every 8 (eight) hours as needed.   amiodarone 200 MG tablet Commonly known as:  PACERONE Take 200 mg by mouth daily.   aspirin 81 MG chewable tablet Chew 81 mg by mouth daily.   CERTAVITE/ANTIOXIDANTS Tabs Take 1 tablet by mouth daily.   chlorhexidine 0.12 % solution Commonly known as:  PERIDEX Use as directed 15 mLs in the mouth or throat at bedtime. Rinse and spit out at bedtime after brushing   clindamycin 300 MG capsule Commonly known as:  CLEOCIN Take 300 mg by mouth every 8 (eight) hours.   clonazePAM 0.5 MG tablet Commonly known as:  KLONOPIN Take 0.5 mg by mouth 2 (two) times daily.   cyanocobalamin 1000 MCG tablet  Take 1,000 mcg by mouth daily.   DERMACLOUD Crea Apply liberal amount topically  to area of skin irritation as needed. Ok to leave at bedside.   docusate sodium 100 MG capsule Commonly known as:  COLACE Take 100 mg by mouth 2 (two) times daily.   furosemide 40 MG tablet Commonly known as:  LASIX Take 40 mg by mouth daily.   ipratropium-albuterol 0.5-2.5 (3) MG/3ML Soln Commonly known as:  DUONEB Take 3 mLs by nebulization 2 (two) times daily.   levothyroxine 137 MCG tablet Commonly known as:  SYNTHROID, LEVOTHROID Take 137 mcg by mouth daily before  breakfast.   lisinopril 5 MG tablet Commonly known as:  PRINIVIL,ZESTRIL Take 5 mg by mouth daily.   lovastatin 20 MG tablet Commonly known as:  MEVACOR Take 20 mg by mouth at bedtime.   memantine 5 MG tablet Commonly known as:  NAMENDA Take 5 mg by mouth 2 (two) times daily.   ondansetron 8 MG tablet Commonly known as:  ZOFRAN Take 8 mg by mouth 2 (two) times daily as needed for nausea or vomiting.   oxycodone 5 MG capsule Commonly known as:  OXY-IR Take 2.5-5 mg by mouth every 4 (four) hours as needed for pain. 1/2 tablet - 1 tablet , 1/2 tab (2.5 mg) for moderate; 1 tab (5 mg) for severe   oxycodone 5 MG capsule Commonly known as:  OXY-IR Take 5 mg by mouth daily. For pain, ok to give prn doses in addition to scheduled   OXYGEN Inhale 3 L into the lungs continuous.   polyethylene glycol packet Commonly known as:  MIRALAX / GLYCOLAX Take 17 g by mouth See admin instructions. On Mon, Wed, Frid for constipation prevention   potassium chloride SA 20 MEQ tablet Commonly known as:  K-DUR,KLOR-CON Take 20 mEq by mouth daily.   pyrithione zinc 1 % shampoo Commonly known as:  HEAD AND SHOULDERS Wash scalp once a day on Tuesday and Friday   QUEtiapine 25 MG tablet Commonly known as:  SEROQUEL Take 25 mg by mouth 2 (two) times daily.   travoprost (benzalkonium) 0.004 % ophthalmic solution Commonly known as:  TRAVATAN Place 1 drop into both eyes at bedtime.   venlafaxine XR 150 MG 24 hr capsule Commonly known as:  EFFEXOR-XR Take 150 mg by mouth daily with breakfast.   Vitamin D3 2000 units capsule Take 2,000 Units by mouth daily.       Review of Systems  Unable to perform ROS: Mental status change  Constitutional: Positive for activity change and fever (low grade). Negative for appetite change, chills and diaphoresis.  HENT: Negative for congestion, sneezing, sore throat, trouble swallowing and voice change.   Respiratory: Negative for apnea, cough, choking,  chest tightness, shortness of breath and wheezing.   Cardiovascular: Negative for chest pain, palpitations and leg swelling.  Gastrointestinal: Negative.   Genitourinary: Negative for difficulty urinating, dysuria, frequency and urgency.  Musculoskeletal: Positive for arthralgias (typical arthritis). Negative for back pain, gait problem and myalgias.  Skin: Positive for color change. Negative for pallor, rash and wound.  Neurological: Positive for weakness. Negative for dizziness, tremors, syncope, speech difficulty, numbness and headaches.  Psychiatric/Behavioral: Negative for agitation and behavioral problems.  All other systems reviewed and are negative.    There is no immunization history on file for this patient. Pertinent  Health Maintenance Due  Topic Date Due  . DEXA SCAN  12/21/1995  . PNA vac Low Risk Adult (1 of 2 - PCV13) 12/21/1995  .  INFLUENZA VACCINE  12/04/2016   No flowsheet data found. Functional Status Survey:    Vitals:   12/16/16 1511  BP: (!) 114/58  Pulse: 86  Resp: 16  Temp: 99.5 F (37.5 C)  SpO2: 99%  Weight: 156 lb 4.8 oz (70.9 kg)  Height: 5' 4.2" (1.631 m)   Body mass index is 26.66 kg/m. Physical Exam  Constitutional: Vital signs are normal. She appears well-developed and well-nourished. She appears lethargic. She is active. She has a sickly appearance. She does not appear ill. No distress. Nasal cannula in place.  HENT:  Head: Normocephalic and atraumatic.    Mouth/Throat: Uvula is midline, oropharynx is clear and moist and mucous membranes are normal. Mucous membranes are not pale, not dry and not cyanotic.  Eyes: Pupils are equal, round, and reactive to light. Conjunctivae, EOM and lids are normal.  Neck: Trachea normal, normal range of motion and full passive range of motion without pain. Neck supple. No JVD present. No tracheal tenderness present. No tracheal deviation, no edema and no erythema present. No thyroid mass and no thyromegaly  present.  Cardiovascular: Normal rate, regular rhythm, normal heart sounds, intact distal pulses and normal pulses.  Exam reveals no gallop, no distant heart sounds and no friction rub.   No murmur heard. Pulses:      Dorsalis pedis pulses are 2+ on the right side, and 2+ on the left side.  Pulmonary/Chest: Effort normal and breath sounds normal. No accessory muscle usage or stridor. No respiratory distress. She has no decreased breath sounds. She has no wheezes. She has no rhonchi. She has no rales. She exhibits no tenderness.  Abdominal: Normal appearance and bowel sounds are normal. She exhibits no distension and no ascites. There is no tenderness.  Musculoskeletal: She exhibits no edema.  Expected osteoarthritis, stiffness; calves soft, supple. Negative Homan's sign  Neurological: She has normal strength. She appears lethargic. She is disoriented.  confused  Skin: Skin is warm and dry. No rash noted. She is not diaphoretic. There is erythema (parotitis). No cyanosis. No pallor. Nails show no clubbing.  Psychiatric: She has a normal mood and affect. Her speech is normal and behavior is normal. Thought content normal. Cognition and memory are impaired. She expresses impulsivity.  Nursing note and vitals reviewed.   Labs reviewed:  Recent Labs  06/18/16 0750 09/10/16 0550  NA 142 138  K 3.0* 4.2  CL 104 99*  CO2 31 29  GLUCOSE 97 82  BUN 15 26*  CREATININE 1.09* 1.40*  CALCIUM 8.6* 9.2  MG  --  2.4    Recent Labs  06/18/16 0750 09/10/16 0550  AST 60* 40  ALT 57* 28  ALKPHOS 71 139*  BILITOT 0.4 0.7  PROT 6.9 7.2  ALBUMIN 3.2* 3.5    Recent Labs  06/18/16 0750 09/10/16 0550  WBC 6.7 8.2  NEUTROABS 3.4 4.6  HGB 13.7 13.7  HCT 40.5 40.7  MCV 93.0 94.8  PLT 241 304   Lab Results  Component Value Date   TSH 1.184 06/18/2016   Lab Results  Component Value Date   HGBA1C 6.9 (H) 02/09/2014   Lab Results  Component Value Date   CHOL 171 09/10/2016   HDL 54  09/10/2016   LDLCALC 72 09/10/2016   TRIG 223 (H) 09/10/2016   CHOLHDL 3.2 09/10/2016    Significant Diagnostic Results in last 30 days:  No results found.  Assessment/Plan 1. Chronic respiratory failure, unsp w hypoxia or hypercapnia (HCC)  Stable  Continue Duo-Nebs BID  2. Dependence on supplemental oxygen  Stable  O2 3 L [place] at all times  3. Hypothyroidism, unspecified type  Stable  Continue Synthroid 137 mcg po Q Day  4. Dementia in other diseases classified elsewhere without behavioral disturbance  Stable  Decrease Namenda 5 mg po Q Day  5. Glaucoma of both eyes, unspecified glaucoma type  Stable  Continue Travatan Z 0.004%- 1 drop in both eyes Q HS  6. Dizziness and giddiness  Stable  7. Essential (primary) hypertension  Stable  Continue Lasix 40 mg po Q Day  Continue Potassium Chloride 20 meq po Q Day  Continue Lisinopril 5 mg po Q Day  8. Major depressive disorder with single episode, remission status unspecified  Stable  Continue Venlafaxine ER 150 mg po Q Day  9. Anxiety disorder, unspecified type  Stable  PRN Alprazolam reordered  Discontinue Clonazepam 0.5 mg po BID- obtunded  Seroquel 25 mg po BID  10. Hyperlipidemia, unspecified hyperlipidemia type  Stable  Continue Lovastatin 20 mg po Q Day  11. Atrial fibrillation with tachycardic ventricular rate (HCC)  Stable  Continue Amiodarone 200 mg po Q Day  12. Sick sinus syndrome (HCC)  Stable  Continue ASA 81 mg po Q Day  13. Nondisplaced transverse fracture of shaft of right tibia, subsequent encounter for closed fracture with routine healing  Stable  Continue Acetaminophen 650 mg po QID  Discontinue Oxycodone 5 mg po TID scheduled, and   Continue Oxycodone 5 mg- 1/2-1 tablet po Q 4 hours prn breakthrough pain  Continue NWB RLE  14. Restlessness and agitation  Stable  15. Vitamin D Deficiency  Stable  Continue Certavite-Antioxidant 1 tablet po Q  Day  Continue Cholecalciferol 2,000 units po Q Day  16. Vitamin B12 Deficiency  Stable  Continue Cyanocobalamin 1,000 mcg po Q Day  17. Constipation  Stable  Continue Colace 100 mg po BID  Continue Miralax 17 g po Q Monday, Wednesday and Friday   Continue Peridex 0.12%- 15 mL swish and spit Q HS for oral health  18. Acute Parotitis  Clindamycin 300 mg po Q 6 hours x 7 days  Lemon Drops candy- give pt lemon drop candy Q 2 hours while awake  Very warm, moist compresses to the jaw QID until healed  Family/ staff Communication:   Total Time:  Documentation:  Face to Face:  Family/Phone:   Labs/tests ordered:    Medication list reviewed and assessed for continued appropriateness. Monthly medication orders reviewed and signed.  Brynda Rim, NP-C [facility] [phone], [place] [address] Cell Phone (Mon-Fri 8am-5pm):  [phone] On Call:  [phone] & follow prompts after 5pm & weekends Office Phone:  [phone] Office Fax:  [phone]

## 2017-01-04 ENCOUNTER — Encounter
Admission: RE | Admit: 2017-01-04 | Discharge: 2017-01-04 | Disposition: A | Discharge status: Home / Self Care | Payer: Medicare Other | Source: Ambulatory Visit | Attending: Internal Medicine | Admitting: Internal Medicine

## 2017-01-16 ENCOUNTER — Non-Acute Institutional Stay (SKILLED_NURSING_FACILITY): Payer: Medicare Other | Admitting: Gerontology

## 2017-01-16 ENCOUNTER — Encounter: Payer: Self-pay | Admitting: Gerontology

## 2017-01-16 DIAGNOSIS — E039 Hypothyroidism, unspecified: Secondary | ICD-10-CM

## 2017-01-16 DIAGNOSIS — H409 Unspecified glaucoma: Secondary | ICD-10-CM

## 2017-01-16 DIAGNOSIS — F419 Anxiety disorder, unspecified: Secondary | ICD-10-CM

## 2017-01-16 DIAGNOSIS — F0280 Dementia in other diseases classified elsewhere without behavioral disturbance: Secondary | ICD-10-CM | POA: Diagnosis not present

## 2017-01-16 DIAGNOSIS — R451 Restlessness and agitation: Secondary | ICD-10-CM

## 2017-01-16 DIAGNOSIS — J9610 Chronic respiratory failure, unspecified whether with hypoxia or hypercapnia: Secondary | ICD-10-CM | POA: Diagnosis not present

## 2017-01-16 DIAGNOSIS — I10 Essential (primary) hypertension: Secondary | ICD-10-CM | POA: Diagnosis not present

## 2017-01-16 DIAGNOSIS — K5900 Constipation, unspecified: Secondary | ICD-10-CM

## 2017-01-16 DIAGNOSIS — I495 Sick sinus syndrome: Secondary | ICD-10-CM | POA: Diagnosis not present

## 2017-01-16 DIAGNOSIS — F329 Major depressive disorder, single episode, unspecified: Secondary | ICD-10-CM

## 2017-01-16 DIAGNOSIS — I4891 Unspecified atrial fibrillation: Secondary | ICD-10-CM

## 2017-01-16 DIAGNOSIS — R42 Dizziness and giddiness: Secondary | ICD-10-CM | POA: Diagnosis not present

## 2017-01-16 DIAGNOSIS — S82224D Nondisplaced transverse fracture of shaft of right tibia, subsequent encounter for closed fracture with routine healing: Secondary | ICD-10-CM

## 2017-01-16 DIAGNOSIS — E538 Deficiency of other specified B group vitamins: Secondary | ICD-10-CM

## 2017-01-16 DIAGNOSIS — Z9981 Dependence on supplemental oxygen: Secondary | ICD-10-CM | POA: Diagnosis not present

## 2017-01-16 DIAGNOSIS — K1121 Acute sialoadenitis: Secondary | ICD-10-CM

## 2017-01-16 DIAGNOSIS — E785 Hyperlipidemia, unspecified: Secondary | ICD-10-CM

## 2017-01-16 DIAGNOSIS — E559 Vitamin D deficiency, unspecified: Secondary | ICD-10-CM

## 2017-01-16 NOTE — Progress Notes (Signed)
Location:   [facility] Room Number: 313B Place of Service:  SNF [phone]) Provider:  Lorenso Quarry, NP-C  Lorenso Quarry, NP  Patient Care Team: Lorenso Quarry, NP as PCP - General (Family Medicine)  Extended Emergency Contact Information Primary Emergency Contact: Emilie Rutter of [place] Home Phone: [phone] Mobile Phone: [phone] Relation: Other Secondary Emergency Contact: Buchanan,Brad Address: [address]          [place], [place] [address] [place] Home Phone: [phone] Work Phone: [phone] Relation: Other  Code Status:  DNR Goals of care: Advanced Directive information Advanced Directives 01/16/2017  Does Patient Have a Medical Advance Directive? Yes  Type of Advance Directive Out of facility DNR (pink MOST or yellow form)  Does patient want to make changes to medical advance directive? No - Patient declined     Chief Complaint  Patient presents with  . Medical Management of Chronic Issues    Routine Visit    HPI:  Pt is a 83 y.o. female seen today for medical management of chronic diseases, as well as acute visit to evaluate Parotidis. Pt has been stable this past month. No new falls reported. Pt reports her depression and anxiety have improved. However, staff report she has frequent episodes of sobbing and uncontrolled crying. Pt is being followed by [facility]. Staff reports her appetite is good and she is having regular BMs. Resident doesn't tend to socialize much with the other residents. Staff denies pt c/o n/v/d/f/c/cp/sob/ha/abd pain/dizziness/cough. Pt observed sitting in [place] in the comm[place] area. Pt was obtunded, pale, diaphoretic- but VSS, even respirations. Will adjust behavior medications. Also, pt with red, warm, swollen painful right jaw. Area is hardened below the TMJ. Low grade temp. Started yesterday. VSS. No other complaints.     Past Medical History:    Diagnosis Date  . Hyperlipidemia, unspecified   . Stroke [facility])    with partial right hemiparesis  . Thyroid disease    Past Surgical History:  Procedure Laterality Date  . APPENDECTOMY      Allergies  Allergen Reactions  . Penicillin G     Other reaction(s): Unknown  . Penicillins     Allergies as of 01/16/2017      Reactions   Penicillin G    Other reaction(s): Unknown   Penicillins       Medication List       Accurate as of 01/16/17  2:09 PM. Always use your most recent med list.          acetaminophen 325 MG tablet Commonly known as:  TYLENOL Take 650 mg by mouth 4 (four) times daily.   ALPRAZolam 0.25 MG tablet Commonly known as:  XANAX Take 0.25 mg by mouth every 8 (eight) hours as needed for anxiety.   amiodarone 200 MG tablet Commonly known as:  PACERONE Take 200 mg by mouth daily.   aspirin 81 MG chewable tablet Chew 81 mg by mouth daily.   busPIRone 5 MG tablet Commonly known as:  BUSPAR Take 5 mg by mouth 2 (two) times daily.   CERTAVITE/ANTIOXIDANTS Tabs Take 1 tablet by mouth daily.   chlorhexidine 0.12 % solution Commonly known as:  PERIDEX Use as directed 15 mLs in the mouth or throat at bedtime. Rinse and spit out at bedtime after brushing   cyanocobalamin 1000 MCG tablet Take 1,000 mcg by mouth daily.   DERMACLOUD Crea Apply liberal amount topically  to area of  skin irritation as needed. Ok to leave at bedside.   docusate sodium 100 MG capsule Commonly known as:  COLACE Take 100 mg by mouth 2 (two) times daily.   furosemide 40 MG tablet Commonly known as:  LASIX Take 40 mg by mouth daily.   ipratropium-albuterol 0.5-2.5 (3) MG/3ML Soln Commonly known as:  DUONEB Take 3 mLs by nebulization 2 (two) times daily.   levothyroxine 137 MCG tablet Commonly known as:  SYNTHROID, LEVOTHROID Take 137 mcg by mouth daily before breakfast.   lisinopril 5 MG tablet Commonly known as:  PRINIVIL,ZESTRIL Take 5 mg by mouth daily.    lovastatin 20 MG tablet Commonly known as:  MEVACOR Take 20 mg by mouth at bedtime.   memantine 5 MG tablet Commonly known as:  NAMENDA Take 5 mg by mouth 2 (two) times daily.   ondansetron 8 MG tablet Commonly known as:  ZOFRAN Take 8 mg by mouth 2 (two) times daily as needed for nausea or vomiting.   oxycodone 5 MG capsule Commonly known as:  OXY-IR Take 2.5-5 mg by mouth every 4 (four) hours as needed for pain. 1/2 tablet - 1 tablet , 1/2 tab (2.5 mg) for moderate; 1 tab (5 mg) for severe   OXYGEN Inhale 3 L into the lungs continuous.   polyethylene glycol packet Commonly known as:  MIRALAX / GLYCOLAX Take 17 g by mouth See admin instructions. On Mon, Wed, Frid for constipation prevention   potassium chloride SA 20 MEQ tablet Commonly known as:  K-DUR,KLOR-CON Take 20 mEq by mouth daily.   pyrithione zinc 1 % shampoo Commonly known as:  HEAD AND SHOULDERS Wash scalp once a day on Tuesday and Friday   QUEtiapine 25 MG tablet Commonly known as:  SEROQUEL Take 25 mg by mouth 2 (two) times daily.   travoprost (benzalkonium) 0.004 % ophthalmic solution Commonly known as:  TRAVATAN Place 1 drop into both eyes at bedtime.   venlafaxine XR 150 MG 24 hr capsule Commonly known as:  EFFEXOR-XR Take 150 mg by mouth daily with breakfast.   Vitamin D3 2000 units capsule Take 2,000 Units by mouth daily.       Review of Systems  Unable to perform ROS: Dementia  Constitutional: Negative for activity change, appetite change, chills, diaphoresis and fever.  HENT: Negative for congestion, sneezing, sore throat, trouble swallowing and voice change.   Respiratory: Negative for apnea, cough, choking, chest tightness, shortness of breath and wheezing.   Cardiovascular: Negative for chest pain, palpitations and leg swelling.  Gastrointestinal: Negative.   Genitourinary: Negative for difficulty urinating, dysuria, frequency and urgency.  Musculoskeletal: Positive for arthralgias  (typical arthritis). Negative for back pain, gait problem and myalgias.  Skin: Negative for color change, pallor, rash and wound.  Neurological: Positive for weakness. Negative for dizziness, tremors, syncope, speech difficulty, numbness and headaches.  Psychiatric/Behavioral: Positive for confusion. Negative for agitation and behavioral problems. The patient is nervous/anxious.   All other systems reviewed and are negative.    There is no immunization history on file for this patient. Pertinent  Health Maintenance Due  Topic Date Due  . DEXA SCAN  12/21/1995  . PNA vac Low Risk Adult (1 of 2 - PCV13) 12/21/1995  . INFLUENZA VACCINE  02/03/2017 (Originally 12/04/2016)   No flowsheet data found. Functional Status Survey:    Vitals:   01/16/17 1344  BP: (!) 112/52  Pulse: 70  Resp: 18  Temp: 98.8 F (37.1 C)  SpO2: 99%  Weight: 156  lb 4.8 oz (70.9 kg)  Height: 5' 4.2" (1.631 m)   Body mass index is 26.66 kg/m. Physical Exam  Constitutional: Vital signs are normal. She appears well-developed and well-nourished. She is active. She does not appear ill. No distress. Nasal cannula in place.  HENT:  Head: Normocephalic and atraumatic.  Mouth/Throat: Uvula is midline, oropharynx is clear and moist and mucous membranes are normal. Mucous membranes are not pale, not dry and not cyanotic.  Eyes: Pupils are equal, round, and reactive to light. Conjunctivae, EOM and lids are normal.  Neck: Trachea normal, normal range of motion and full passive range of motion without pain. Neck supple. No JVD present. No tracheal tenderness present. No tracheal deviation, no edema and no erythema present. No thyroid mass and no thyromegaly present.  Cardiovascular: Normal rate, regular rhythm, normal heart sounds, intact distal pulses and normal pulses.  Exam reveals no gallop, no distant heart sounds and no friction rub.   No murmur heard. Pulses:      Dorsalis pedis pulses are 2+ on the right side, and  2+ on the left side.  Pulmonary/Chest: Effort normal and breath sounds normal. No accessory muscle usage or stridor. No respiratory distress. She has no decreased breath sounds. She has no wheezes. She has no rhonchi. She has no rales. She exhibits no tenderness.  Abdominal: Normal appearance and bowel sounds are normal. She exhibits no distension and no ascites. There is no tenderness.  Musculoskeletal: She exhibits no edema.  Expected osteoarthritis, stiffness; calves soft, supple. Negative Homan's sign. Generalized weakness  Neurological: She is alert. She has normal strength. She is disoriented.  confused  Skin: Skin is warm and dry. No rash noted. She is not diaphoretic. No cyanosis or erythema. No pallor. Nails show no clubbing.  Psychiatric: She has a normal mood and affect. Her speech is normal and behavior is normal. Thought content normal. Cognition and memory are impaired. She expresses impulsivity.  Nursing note and vitals reviewed.   Labs reviewed:  Recent Labs  06/18/16 0750 09/10/16 0550  NA 142 138  K 3.0* 4.2  CL 104 99*  CO2 31 29  GLUCOSE 97 82  BUN 15 26*  CREATININE 1.09* 1.40*  CALCIUM 8.6* 9.2  MG  --  2.4    Recent Labs  06/18/16 0750 09/10/16 0550  AST 60* 40  ALT 57* 28  ALKPHOS 71 139*  BILITOT 0.4 0.7  PROT 6.9 7.2  ALBUMIN 3.2* 3.5    Recent Labs  06/18/16 0750 09/10/16 0550  WBC 6.7 8.2  NEUTROABS 3.4 4.6  HGB 13.7 13.7  HCT 40.5 40.7  MCV 93.0 94.8  PLT 241 304   Lab Results  Component Value Date   TSH 1.184 06/18/2016   Lab Results  Component Value Date   HGBA1C 6.9 (H) 02/09/2014   Lab Results  Component Value Date   CHOL 171 09/10/2016   HDL 54 09/10/2016   LDLCALC 72 09/10/2016   TRIG 223 (H) 09/10/2016   CHOLHDL 3.2 09/10/2016    Significant Diagnostic Results in last 30 days:  No results found.  Assessment/Plan 1. Chronic respiratory failure, unsp w hypoxia or hypercapnia (HCC)  Stable  Continue Duo-Nebs  BID  2. Dependence on supplemental oxygen  Stable  O2 3 L [place] at all times  3. Hypothyroidism, unspecified type  Stable  Continue Synthroid 137 mcg po Q Day  4. Dementia in other diseases classified elsewhere without behavioral disturbance  Stable  Decrease Namenda 5 mg  po Q Day  5. Glaucoma of both eyes, unspecified glaucoma type  Stable  Continue Travatan Z 0.004%- 1 drop in both eyes Q HS  6. Dizziness and giddiness  Stable  7. Essential (primary) hypertension  Stable  Continue Lasix 40 mg po Q Day  Continue Potassium Chloride 20 meq po Q Day  Continue Lisinopril 5 mg po Q Day  8. Major depressive disorder with single episode, remission status unspecified  Stable  Continue Venlafaxine ER 150 mg po Q Day  9. Anxiety disorder, unspecified type  Stable  PRN Alprazolam reordered  Seroquel 25 mg po BID  10. Hyperlipidemia, unspecified hyperlipidemia type  Stable  Continue Lovastatin 20 mg po Q Day  11. Atrial fibrillation with tachycardic ventricular rate (HCC)  Stable  Continue Amiodarone 200 mg po Q Day  12. Sick sinus syndrome (HCC)  Stable  Continue ASA 81 mg po Q Day  13. Nondisplaced transverse fracture of shaft of right tibia, subsequent encounter for closed fracture with routine healing  Stable  Continue Acetaminophen 650 mg po QID  Continue Oxycodone 5 mg- 1/2-1 tablet po Q 4 hours prn breakthrough pain  Continue NWB RLE  14. Restlessness and agitation  Stable  15. Vitamin D Deficiency  Stable  Continue Certavite-Antioxidant 1 tablet po Q Day  Continue Cholecalciferol 2,000 units po Q Day  16. Vitamin B12 Deficiency  Stable  Continue Cyanocobalamin 1,000 mcg po Q Day  17. Constipation  Stable  Continue Colace 100 mg po BID  Continue Miralax 17 g po Q Monday, Wednesday and Friday   Continue Peridex 0.12%- 15 mL swish and spit Q HS for oral health  18. Acute Parotitis  Resolved   Family/ staff  Communication:   Total Time:  Documentation:  Face to Face:  Family/Phone:   Labs/tests ordered:  Not due  Medication list reviewed and assessed for continued appropriateness. Monthly medication orders reviewed and signed.  Brynda RimShannon H. Aldonia Keeven, NP-C Geriatrics [facility] [phone] [place], [place] [id number] Cell Phone (Mon-Fri 8am-5pm):  [phone] On Call:  [phone] & follow prompts after 5pm & weekends Office Phone:  [phone] Office Fax:  [phone]

## 2017-01-31 ENCOUNTER — Other Ambulatory Visit: Payer: Self-pay

## 2017-01-31 MED ORDER — TRAMADOL HCL 50 MG PO TABS: 50.0000 mg | ORAL_TABLET | Freq: Four times a day (QID) | ORAL | 0 refills | Status: AC | PRN

## 2017-01-31 MED ORDER — TRAMADOL HCL 50 MG PO TABS: 50.0000 mg | ORAL_TABLET | Freq: Three times a day (TID) | ORAL | 0 refills | Status: AC

## 2017-01-31 NOTE — Telephone Encounter (Signed)
Rx sent to [facility] phone : [phone] , fax : [phone]  

## 2017-02-03 ENCOUNTER — Encounter
Admission: RE | Admit: 2017-02-03 | Discharge: 2017-02-03 | Disposition: A | Discharge status: Home / Self Care | Payer: Medicare Other | Source: Ambulatory Visit | Attending: Internal Medicine | Admitting: Internal Medicine

## 2017-02-26 ENCOUNTER — Non-Acute Institutional Stay (SKILLED_NURSING_FACILITY): Payer: Medicare Other | Admitting: Gerontology

## 2017-02-26 ENCOUNTER — Encounter: Payer: Self-pay | Admitting: Gerontology

## 2017-02-26 DIAGNOSIS — F329 Major depressive disorder, single episode, unspecified: Secondary | ICD-10-CM

## 2017-02-26 DIAGNOSIS — J9610 Chronic respiratory failure, unspecified whether with hypoxia or hypercapnia: Secondary | ICD-10-CM | POA: Diagnosis not present

## 2017-02-26 DIAGNOSIS — I10 Essential (primary) hypertension: Secondary | ICD-10-CM | POA: Diagnosis not present

## 2017-02-26 DIAGNOSIS — S82224D Nondisplaced transverse fracture of shaft of right tibia, subsequent encounter for closed fracture with routine healing: Secondary | ICD-10-CM

## 2017-02-26 DIAGNOSIS — E039 Hypothyroidism, unspecified: Secondary | ICD-10-CM | POA: Diagnosis not present

## 2017-02-26 DIAGNOSIS — Z9981 Dependence on supplemental oxygen: Secondary | ICD-10-CM

## 2017-02-26 DIAGNOSIS — E785 Hyperlipidemia, unspecified: Secondary | ICD-10-CM

## 2017-02-26 DIAGNOSIS — E559 Vitamin D deficiency, unspecified: Secondary | ICD-10-CM

## 2017-02-26 DIAGNOSIS — F0280 Dementia in other diseases classified elsewhere without behavioral disturbance: Secondary | ICD-10-CM

## 2017-02-26 DIAGNOSIS — F419 Anxiety disorder, unspecified: Secondary | ICD-10-CM | POA: Diagnosis not present

## 2017-02-26 DIAGNOSIS — H409 Unspecified glaucoma: Secondary | ICD-10-CM | POA: Diagnosis not present

## 2017-02-26 DIAGNOSIS — I4891 Unspecified atrial fibrillation: Secondary | ICD-10-CM

## 2017-02-26 DIAGNOSIS — R42 Dizziness and giddiness: Secondary | ICD-10-CM

## 2017-02-26 DIAGNOSIS — K5900 Constipation, unspecified: Secondary | ICD-10-CM

## 2017-02-26 DIAGNOSIS — I495 Sick sinus syndrome: Secondary | ICD-10-CM

## 2017-02-26 DIAGNOSIS — R451 Restlessness and agitation: Secondary | ICD-10-CM

## 2017-02-26 DIAGNOSIS — E538 Deficiency of other specified B group vitamins: Secondary | ICD-10-CM

## 2017-03-05 NOTE — Progress Notes (Signed)
Location:   [facility] Room Number: 313B Place of Service:  SNF [phone]) Provider:  Lorenso Quarry, NP-C  Lorenso Quarry, NP  Patient Care Team: Lorenso Quarry, NP as PCP - General (Family Medicine)  Extended Emergency Contact Information Primary Emergency Contact: Emilie Rutter of [place] Home Phone: [phone] Mobile Phone: [phone] Relation: Other Secondary Emergency Contact: Buchanan,Brad Address: [address]          [place], [place] [address] [place] Home Phone: [phone] Work Phone: [phone] Relation: Other  Code Status:  DNR Goals of care: Advanced Directive information Advanced Directives 02/26/2017  Does Patient Have a Medical Advance Directive? Yes  Type of Advance Directive Out of facility DNR (pink MOST or yellow form)  Does patient want to make changes to medical advance directive? No - Patient declined     Chief Complaint  Patient presents with  . Medical Management of Chronic Issues    Routine Visit    HPI:  Pt is a 83 y.o. female seen today for medical management of chronic diseases.  The patient has been stable this past month.  No new falls reported.  Patient reports her depression and anxiety have improved.  However staff reports she has frequent episodes of sobbing and uncontrolled crying.  Recent increase in dose of Seroquel and buspirone.  Symptoms seem to have significantly improved.  Staff reports her appetite is good and she is having regular BMs.  Resident does not tend to socialize much with the other residents.  Patient observed sitting in [place] chair in the commons area.  She is alert and appeared content.  Vital signs stable.  No other complaints   Past Medical History:  Diagnosis Date  . Hyperlipidemia, unspecified   . Stroke [facility])    with partial right hemiparesis  . Thyroid disease    Past Surgical History:  Procedure Laterality Date  . APPENDECTOMY      Allergies    Allergen Reactions  . Penicillin G     Other reaction(s): Unknown  . Penicillins     Allergies as of 02/26/2017      Reactions   Penicillin G    Other reaction(s): Unknown   Penicillins       Medication List       Accurate as of 02/26/17 11:59 PM. Always use your most recent med list.          acetaminophen 325 MG tablet Commonly known as:  TYLENOL Take 650 mg by mouth 4 (four) times daily.   alum & mag hydroxide-simeth 400-400-40 MG/5ML suspension Commonly known as:  MAALOX PLUS Take 30 mLs by mouth every 4 (four) hours as needed for indigestion.   amiodarone 200 MG tablet Commonly known as:  PACERONE Take 200 mg by mouth daily.   aspirin 81 MG chewable tablet Chew 81 mg by mouth daily.   busPIRone 10 MG tablet Commonly known as:  BUSPAR Take 10 mg by mouth 2 (two) times daily.   CERTAVITE/ANTIOXIDANTS Tabs Take 1 tablet by mouth daily.   chlorhexidine 0.12 % solution Commonly known as:  PERIDEX Use as directed 15 mLs in the mouth or throat at bedtime. Rinse and spit out at bedtime after brushing   cyanocobalamin 1000 MCG tablet Take 1,000 mcg by mouth daily.   DERMACLOUD Crea Apply liberal amount topically  to area of skin irritation as needed. Ok to leave at bedside.   docusate sodium 100 MG capsule Commonly known  as:  COLACE Take 100 mg by mouth 2 (two) times daily.   furosemide 40 MG tablet Commonly known as:  LASIX Take 40 mg by mouth daily.   ipratropium-albuterol 0.5-2.5 (3) MG/3ML Soln Commonly known as:  DUONEB Take 3 mLs by nebulization 2 (two) times daily.   levothyroxine 137 MCG tablet Commonly known as:  SYNTHROID, LEVOTHROID Take 137 mcg by mouth daily before breakfast.   lisinopril 5 MG tablet Commonly known as:  PRINIVIL,ZESTRIL Take 5 mg by mouth daily.   lovastatin 20 MG tablet Commonly known as:  MEVACOR Take 20 mg by mouth at bedtime.   ondansetron 8 MG tablet Commonly known as:  ZOFRAN Take 8 mg by mouth 2 (two)  times daily as needed for nausea or vomiting.   OXYGEN Inhale 3 L into the lungs continuous.   polyethylene glycol packet Commonly known as:  MIRALAX / GLYCOLAX Take 17 g by mouth See admin instructions. On Mon, Wed, Frid for constipation prevention   potassium chloride SA 20 MEQ tablet Commonly known as:  K-DUR,KLOR-CON Take 20 mEq by mouth daily.   pyrithione zinc 1 % shampoo Commonly known as:  HEAD AND SHOULDERS Wash scalp once a day on Tuesday and Friday   QUEtiapine 25 MG tablet Commonly known as:  SEROQUEL Take 37.5 mg by mouth 2 (two) times daily. 1 and 1/2 tab   traMADol 50 MG tablet Commonly known as:  ULTRAM Take 1 tablet (50 mg total) by mouth every 6 (six) hours as needed. Ok to hold for sedation   traMADol 50 MG tablet Commonly known as:  ULTRAM Take 1 tablet (50 mg total) by mouth 3 (three) times daily.   travoprost (benzalkonium) 0.004 % ophthalmic solution Commonly known as:  TRAVATAN Place 1 drop into both eyes at bedtime.   venlafaxine XR 150 MG 24 hr capsule Commonly known as:  EFFEXOR-XR Take 150 mg by mouth daily with breakfast.   Vitamin D3 2000 units capsule Take 2,000 Units by mouth daily.       Review of Systems  Unable to perform ROS: Dementia  Constitutional: Negative for activity change, appetite change, chills, diaphoresis and fever.  HENT: Negative for congestion, sneezing, sore throat, trouble swallowing and voice change.   Respiratory: Negative for apnea, cough, choking, chest tightness, shortness of breath and wheezing.   Cardiovascular: Negative for chest pain, palpitations and leg swelling.  Gastrointestinal: Negative.  Negative for abdominal distention, abdominal pain, constipation, diarrhea and nausea.  Genitourinary: Negative for difficulty urinating, dysuria, frequency and urgency.  Musculoskeletal: Positive for arthralgias (typical arthritis). Negative for back pain, gait problem and myalgias.  Skin: Negative for color  change, pallor, rash and wound.  Neurological: Positive for weakness. Negative for dizziness, tremors, syncope, speech difficulty, numbness and headaches.  Psychiatric/Behavioral: Positive for confusion. Negative for agitation and behavioral problems. The patient is nervous/anxious.   All other systems reviewed and are negative.   Immunization History  Administered Date(s) Administered  . Influenza-Unspecified 01/31/2017  . PPD Test 06/13/2016   Pertinent  Health Maintenance Due  Topic Date Due  . DEXA SCAN  12/21/1995  . PNA vac Low Risk Adult (1 of 2 - PCV13) 12/21/1995  . INFLUENZA VACCINE  Completed   No flowsheet data found. Functional Status Survey:    Vitals:   02/26/17 1150  BP: (!) 96/53  Pulse: 61  Resp: 16  Temp: (!) 97.5 F (36.4 C)  SpO2: 97%  Weight: 144 lb 4.8 oz (65.5 kg)  Height: 5'  4.2" (1.631 m)   Body mass index is 24.61 kg/m. Physical Exam  Constitutional: Vital signs are normal. She appears well-developed and well-nourished. She is active and cooperative. She does not appear ill. No distress. Nasal cannula in place.  HENT:  Head: Normocephalic and atraumatic.  Mouth/Throat: Uvula is midline, oropharynx is clear and moist and mucous membranes are normal. Mucous membranes are not pale, not dry and not cyanotic.  Eyes: Pupils are equal, round, and reactive to light. Conjunctivae, EOM and lids are normal.  Neck: Trachea normal, normal range of motion and full passive range of motion without pain. Neck supple. No JVD present. No tracheal tenderness present. No tracheal deviation, no edema and no erythema present. No thyroid mass and no thyromegaly present.  Cardiovascular: Normal rate, regular rhythm, normal heart sounds, intact distal pulses and normal pulses.  Exam reveals no gallop, no distant heart sounds and no friction rub.   No murmur heard. Pulses:      Dorsalis pedis pulses are 2+ on the right side, and 2+ on the left side.  No edema    Pulmonary/Chest: Effort normal and breath sounds normal. No accessory muscle usage or stridor. No respiratory distress. She has no decreased breath sounds. She has no wheezes. She has no rhonchi. She has no rales. She exhibits no tenderness.  Abdominal: Normal appearance and bowel sounds are normal. She exhibits no distension and no ascites. There is no tenderness.  Musculoskeletal: Normal range of motion. She exhibits no edema or tenderness.  Expected osteoarthritis, stiffness; calves soft, supple. Negative Homan's sign. Generalized weakness  Neurological: She is alert. She has normal strength. She is disoriented.  confused  Skin: Skin is warm, dry and intact. No rash noted. She is not diaphoretic. No cyanosis or erythema. No pallor. Nails show no clubbing.  Psychiatric: Her speech is normal. Thought content normal. Her mood appears anxious. She is agitated (at times). Cognition and memory are impaired. She expresses impulsivity and inappropriate judgment. She exhibits abnormal recent memory.  Nursing note and vitals reviewed.   Labs reviewed:  Recent Labs  06/18/16 0750 09/10/16 0550  NA 142 138  K 3.0* 4.2  CL 104 99*  CO2 31 29  GLUCOSE 97 82  BUN 15 26*  CREATININE 1.09* 1.40*  CALCIUM 8.6* 9.2  MG  --  2.4    Recent Labs  06/18/16 0750 09/10/16 0550  AST 60* 40  ALT 57* 28  ALKPHOS 71 139*  BILITOT 0.4 0.7  PROT 6.9 7.2  ALBUMIN 3.2* 3.5    Recent Labs  06/18/16 0750 09/10/16 0550  WBC 6.7 8.2  NEUTROABS 3.4 4.6  HGB 13.7 13.7  HCT 40.5 40.7  MCV 93.0 94.8  PLT 241 304   Lab Results  Component Value Date   TSH 1.184 06/18/2016   Lab Results  Component Value Date   HGBA1C 6.9 (H) 02/09/2014   Lab Results  Component Value Date   CHOL 171 09/10/2016   HDL 54 09/10/2016   LDLCALC 72 09/10/2016   TRIG 223 (H) 09/10/2016   CHOLHDL 3.2 09/10/2016    Significant Diagnostic Results in last 30 days:  No results found.  Assessment/Plan 1. Chronic  respiratory failure, unsp w hypoxia or hypercapnia (HCC)  Stable  Continue Duo-Nebs BID  2. Dependence on supplemental oxygen  Stable  O2 3 L [place] at all times  3. Hypothyroidism, unspecified type  Stable  Continue Synthroid 137 mcg po Q Day  4. Dementia in other diseases classified  elsewhere without behavioral disturbance  Stable  D/C Namenda 5 mg po Q Day  5. Glaucoma of both eyes, unspecified glaucoma type  Stable  Continue Travatan Z 0.004%- 1 drop in both eyes Q HS  6. Dizziness and giddiness  Stable  7. Essential (primary) hypertension  Stable  Continue Lasix 40 mg po Q Day  Continue Potassium Chloride 20 meq po Q Day  Continue Lisinopril 5 mg po Q Day  8. Major depressive disorder with single episode, remission status unspecified  Stable  Continue Venlafaxine ER 150 mg po Q Day  9. Anxiety disorder, unspecified type  Stable  Seroquel 50 mg po BID  Buspirone 10 mg p.o. twice daily  10. Hyperlipidemia, unspecified hyperlipidemia type  Stable  Continue Lovastatin 20 mg po Q Day  11. Atrial fibrillation with tachycardic ventricular rate (HCC)  Stable  Continue Amiodarone 200 mg po Q Day  12. Sick sinus syndrome (HCC)  Stable  Continue ASA 81 mg po Q Day  13. Nondisplaced transverse fracture of shaft of right tibia, subsequent encounter for closed fracture with routine healing  Stable  Continue Acetaminophen 650 mg po QID  Tramadol 50 mg p.o. 3 times daily scheduled for pain   Tramadol 50 mg 1 tab p.o. every 6 hours as needed breakthrough pain  Continue NWB RLE  14. Restlessness and agitation  Stable  15. Vitamin D Deficiency  Stable  Continue Certavite-Antioxidant 1 tablet po Q Day  Continue Cholecalciferol 2,000 units po Q Day  16. Vitamin B12 Deficiency  Stable  Continue Cyanocobalamin 1,000 mcg po Q Day  17. Constipation  Stable  Continue Colace 100 mg po BID  Continue Miralax 17 g po Q Monday,  Wednesday and Friday   Continue Peridex 0.12%- 15 mL swish and spit Q HS for oral health  Continue all medications as listed above unless otherwise indicated  Family/ staff Communication:   Total Time:  Documentation:  Face to Face:  Family/Phone:   Labs/tests ordered: CBC, admit see, lipid panel, D, B12, mag  Medication list reviewed and assessed for continued appropriateness. Monthly medication orders reviewed and signed.  Brynda RimShannon H. Nashae Maudlin, NP-C Geriatrics [facility] [id number] [place], [place] [address] Cell Phone (Mon-Fri 8am-5pm):  [phone] On Call:  [phone] & follow prompts after 5pm & weekends Office Phone:  [phone] Office Fax:  [phone]

## 2017-03-06 ENCOUNTER — Encounter
Admission: RE | Admit: 2017-03-06 | Discharge: 2017-03-06 | Disposition: A | Discharge status: Home / Self Care | Payer: Medicare Other | Source: Ambulatory Visit | Attending: Internal Medicine | Admitting: Internal Medicine

## 2017-03-07 ENCOUNTER — Other Ambulatory Visit
Admission: RE | Admit: 2017-03-07 | Discharge: 2017-03-07 | Disposition: A | Discharge status: Home / Self Care | Payer: Medicare Other | Source: Skilled Nursing Facility | Attending: Gerontology | Admitting: Gerontology

## 2017-03-07 DIAGNOSIS — F0281 Dementia in other diseases classified elsewhere with behavioral disturbance: Secondary | ICD-10-CM | POA: Insufficient documentation

## 2017-03-07 LAB — CBC WITH DIFFERENTIAL/PLATELET
BASOS ABS: 0.1 10*3/uL (ref 0–0.1)
Basophils Relative: 1 %
EOS PCT: 1 %
Eosinophils Absolute: 0.1 10*3/uL (ref 0–0.7)
HEMATOCRIT: 41.7 % (ref 35.0–47.0)
HEMOGLOBIN: 14 g/dL (ref 12.0–16.0)
LYMPHS ABS: 2.5 10*3/uL (ref 1.0–3.6)
LYMPHS PCT: 37 %
MCH: 32.2 pg (ref 26.0–34.0)
MCHC: 33.5 g/dL (ref 32.0–36.0)
MCV: 96.1 fL (ref 80.0–100.0)
Monocytes Absolute: 0.4 10*3/uL (ref 0.2–0.9)
Monocytes Relative: 6 %
NEUTROS ABS: 3.8 10*3/uL (ref 1.4–6.5)
Neutrophils Relative %: 55 %
Platelets: 196 10*3/uL (ref 150–440)
RBC: 4.34 MIL/uL (ref 3.80–5.20)
RDW: 13.5 % (ref 11.5–14.5)
WBC: 6.9 10*3/uL (ref 3.6–11.0)

## 2017-03-07 LAB — COMPREHENSIVE METABOLIC PANEL
ALK PHOS: 77 U/L (ref 38–126)
ALT: 19 U/L (ref 14–54)
AST: 27 U/L (ref 15–41)
Albumin: 3.6 g/dL (ref 3.5–5.0)
Anion gap: 9 (ref 5–15)
BUN: 21 mg/dL — AB (ref 6–20)
CALCIUM: 9.1 mg/dL (ref 8.9–10.3)
CO2: 28 mmol/L (ref 22–32)
CREATININE: 1.16 mg/dL — AB (ref 0.44–1.00)
Chloride: 101 mmol/L (ref 101–111)
GFR calc non Af Amer: 41 mL/min — ABNORMAL LOW (ref >60)
GFR, EST AFRICAN AMERICAN: 48 mL/min — AB (ref >60)
GLUCOSE: 79 mg/dL (ref 65–99)
Potassium: 4.2 mmol/L (ref 3.5–5.1)
SODIUM: 138 mmol/L (ref 135–145)
Total Bilirubin: 0.5 mg/dL (ref 0.3–1.2)
Total Protein: 6.7 g/dL (ref 6.5–8.1)

## 2017-03-10 ENCOUNTER — Other Ambulatory Visit: Payer: Self-pay

## 2017-03-10 MED ORDER — ALPRAZOLAM 0.25 MG PO TABS: 0.2500 mg | ORAL_TABLET | Freq: Three times a day (TID) | ORAL | 0 refills | Status: AC | PRN

## 2017-03-10 NOTE — Telephone Encounter (Signed)
Rx sent to [facility] phone : [phone] , fax : [phone]  

## 2017-03-11 ENCOUNTER — Other Ambulatory Visit
Admission: RE | Admit: 2017-03-11 | Discharge: 2017-03-11 | Disposition: A | Discharge status: Home / Self Care | Payer: Medicare Other | Source: Other Acute Inpatient Hospital | Attending: Gerontology | Admitting: Gerontology

## 2017-03-11 DIAGNOSIS — F0281 Dementia in other diseases classified elsewhere with behavioral disturbance: Secondary | ICD-10-CM | POA: Diagnosis present

## 2017-03-11 LAB — COMPREHENSIVE METABOLIC PANEL
ALK PHOS: 84 U/L (ref 38–126)
ALT: 16 U/L (ref 14–54)
AST: 23 U/L (ref 15–41)
Albumin: 3.2 g/dL — ABNORMAL LOW (ref 3.5–5.0)
Anion gap: 9 (ref 5–15)
BILIRUBIN TOTAL: 0.4 mg/dL (ref 0.3–1.2)
BUN: 22 mg/dL — AB (ref 6–20)
CALCIUM: 8.7 mg/dL — AB (ref 8.9–10.3)
CHLORIDE: 103 mmol/L (ref 101–111)
CO2: 29 mmol/L (ref 22–32)
CREATININE: 1.18 mg/dL — AB (ref 0.44–1.00)
GFR calc non Af Amer: 41 mL/min — ABNORMAL LOW (ref >60)
GFR, EST AFRICAN AMERICAN: 47 mL/min — AB (ref >60)
Glucose, Bld: 79 mg/dL (ref 65–99)
Potassium: 4 mmol/L (ref 3.5–5.1)
Sodium: 141 mmol/L (ref 135–145)
TOTAL PROTEIN: 6.1 g/dL — AB (ref 6.5–8.1)

## 2017-03-11 LAB — VITAMIN B12: Vitamin B-12: 1004 pg/mL — ABNORMAL HIGH (ref 180–914)

## 2017-03-11 LAB — CBC WITH DIFFERENTIAL/PLATELET
Basophils Absolute: 0.1 10*3/uL (ref 0–0.1)
Basophils Relative: 1 %
EOS ABS: 0.1 10*3/uL (ref 0–0.7)
Eosinophils Relative: 3 %
HEMATOCRIT: 39 % (ref 35.0–47.0)
HEMOGLOBIN: 13.1 g/dL (ref 12.0–16.0)
LYMPHS ABS: 2.4 10*3/uL (ref 1.0–3.6)
LYMPHS PCT: 47 %
MCH: 32.3 pg (ref 26.0–34.0)
MCHC: 33.6 g/dL (ref 32.0–36.0)
MCV: 96.3 fL (ref 80.0–100.0)
MONOS PCT: 8 %
Monocytes Absolute: 0.4 10*3/uL (ref 0.2–0.9)
NEUTROS PCT: 41 %
Neutro Abs: 2.1 10*3/uL (ref 1.4–6.5)
Platelets: 165 10*3/uL (ref 150–440)
RBC: 4.05 MIL/uL (ref 3.80–5.20)
RDW: 13.6 % (ref 11.5–14.5)
WBC: 5.1 10*3/uL (ref 3.6–11.0)

## 2017-03-11 LAB — TSH: TSH: 0.124 u[IU]/mL — ABNORMAL LOW (ref 0.350–4.500)

## 2017-03-11 LAB — MAGNESIUM: MAGNESIUM: 2.3 mg/dL (ref 1.7–2.4)

## 2017-03-12 LAB — VITAMIN D 25 HYDROXY (VIT D DEFICIENCY, FRACTURES): Vit D, 25-Hydroxy: 31.3 ng/mL (ref 30.0–100.0)

## 2017-04-03 ENCOUNTER — Encounter: Payer: Self-pay | Admitting: Gerontology

## 2017-04-03 ENCOUNTER — Non-Acute Institutional Stay (SKILLED_NURSING_FACILITY): Payer: Medicare Other | Admitting: Gerontology

## 2017-04-03 DIAGNOSIS — H409 Unspecified glaucoma: Secondary | ICD-10-CM | POA: Diagnosis not present

## 2017-04-03 DIAGNOSIS — F419 Anxiety disorder, unspecified: Secondary | ICD-10-CM

## 2017-04-03 DIAGNOSIS — F329 Major depressive disorder, single episode, unspecified: Secondary | ICD-10-CM

## 2017-04-05 ENCOUNTER — Encounter
Admission: RE | Admit: 2017-04-05 | Discharge: 2017-04-05 | Disposition: A | Discharge status: Home / Self Care | Payer: Medicare Other | Source: Ambulatory Visit | Attending: Internal Medicine | Admitting: Internal Medicine

## 2017-04-17 ENCOUNTER — Encounter: Payer: Self-pay | Admitting: Gerontology

## 2017-04-17 ENCOUNTER — Non-Acute Institutional Stay (SKILLED_NURSING_FACILITY): Payer: Medicare Other | Admitting: Gerontology

## 2017-04-17 DIAGNOSIS — B029 Zoster without complications: Secondary | ICD-10-CM | POA: Diagnosis not present

## 2017-04-17 NOTE — Progress Notes (Signed)
Location:    [facility] Room Number: 313B Place of Service:  SNF [phone]) Provider:  Lorenso QuarryShannon Maribelle Hopple, NP-C  Lorenso QuarryLeach, Deryn Massengale, NP  Patient Care Team: Lorenso QuarryLeach, Yu Peggs, NP as PCP - General (Family Medicine)  Extended Emergency Contact Information Primary Emergency Contact: Emilie Rutteriley,Angela  [place] Home Phone: [phone] Mobile Phone: [phone] Relation: Other Secondary Emergency Contact: Buchanan,Brad Address: [address]          [place], [place] [address] [place] Home Phone: [phone] Work Phone: [phone] Relation: Other  Code Status:  DNR Goals of care: Advanced Directive information Advanced Directives 04/17/2017  Does Patient Have a Medical Advance Directive? Yes  Type of Advance Directive Out of facility DNR (pink MOST or yellow form)  Does patient want to make changes to medical advance directive? No - Patient declined     Chief Complaint  Patient presents with  . Acute Visit    Check rash     HPI:  Pt is a 83 y.o. female seen today for an acute visit for red painful rash along the right flank.  Staff noticed a rash starting several days ago.  Rash is red, dried pustules and and induration along a single dermatome of the right flank.  Rash is painful to touch.  No warmth.  No drainage.  Patient with very limited verbal ability, unable to give complete ROS   Past Medical History:  Diagnosis Date  . Hyperlipidemia, unspecified   . 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 Thyroid disease    Past Surgical History:  Procedure Laterality Date  . APPENDECTOMY      Allergies  Allergen Reactions  . Penicillin G     Other reaction(s): Unknown  . Penicillins     Allergies as of 04/17/2017      Reactions   Penicillin G    Other reaction(s): Unknown   Penicillins       Medication List        Accurate as of 04/17/17  5:06 PM. Always use your most recent med list.            acetaminophen 325 MG tablet Commonly known as:  TYLENOL Take 650 mg by mouth 4 (four) times daily.   ALPRAZolam 0.25 MG tablet Commonly known as:  XANAX Take 0.25 mg by mouth every 8 (eight) hours as needed for anxiety.   alum & mag hydroxide-simeth 400-400-40 MG/5ML suspension Commonly known as:  MAALOX PLUS Take 30 mLs by mouth every 4 (four) hours as needed for indigestion.   amiodarone 200 MG tablet Commonly known as:  PACERONE Take 200 mg by mouth daily.   aspirin 81 MG chewable tablet Chew 81 mg by mouth daily.   busPIRone 10 MG tablet Commonly known as:  BUSPAR Take 10 mg by mouth 2 (two) times daily.   CERTAVITE/ANTIOXIDANTS Tabs Take 1 tablet by mouth daily.   chlorhexidine 0.12 % solution Commonly known as:  PERIDEX Use as directed 15 mLs in the mouth or throat at bedtime. Rinse and spit out at bedtime after brushing   cyanocobalamin 1000 MCG tablet Take 1,000 mcg by mouth daily.   DERMACLOUD Crea Apply liberal amount topically  to area of skin irritation as needed. Ok to leave at bedside.   divalproex 125 MG capsule Commonly known as:  DEPAKOTE SPRINKLE Take 250 mg by mouth 2 (two) times daily.   docusate sodium 100 MG capsule Commonly known as:  COLACE Take 100 mg by mouth 2 (two) times daily.   furosemide 40 MG tablet Commonly known as:  LASIX Take 40 mg by mouth daily.   ipratropium-albuterol 0.5-2.5 (3) MG/3ML Soln Commonly known as:  DUONEB Take 3 mLs by nebulization 2 (two) times daily.   levothyroxine 137 MCG tablet Commonly known as:  SYNTHROID, LEVOTHROID Take 137 mcg by mouth daily before breakfast.   lisinopril 5 MG tablet Commonly known as:  PRINIVIL,ZESTRIL Take 5 mg by mouth daily.   lovastatin 20 MG tablet Commonly known as:  MEVACOR Take 20 mg by mouth at bedtime.   ondansetron 8 MG tablet Commonly known as:  ZOFRAN Take 8 mg by mouth 2 (two) times daily as needed for nausea or vomiting.   OXYGEN Inhale 3 L into the  lungs continuous.   polyethylene glycol packet Commonly known as:  MIRALAX / GLYCOLAX Take 17 g by mouth See admin instructions. On Mon, Wed, Frid for constipation prevention   potassium chloride SA 20 MEQ tablet Commonly known as:  K-DUR,KLOR-CON Take 20 mEq by mouth daily.   pyrithione zinc 1 % shampoo Commonly known as:  HEAD AND SHOULDERS Wash scalp once a day on Tuesday and Friday   QUEtiapine 50 MG tablet Commonly known as:  SEROQUEL Take 50 mg by mouth every 12 (twelve) hours.   traMADol 50 MG tablet Commonly known as:  ULTRAM Take 1 tablet (50 mg total) by mouth every 6 (six) hours as needed. Ok to hold for sedation   traMADol 50 MG tablet Commonly known as:  ULTRAM Take 1 tablet (50 mg total) by mouth 3 (three) times daily.   travoprost (benzalkonium) 0.004 % ophthalmic solution Commonly known as:  TRAVATAN Place 1 drop into both eyes at bedtime.   venlafaxine XR 150 MG 24 hr capsule Commonly known as:  EFFEXOR-XR Take 150 mg by mouth daily with breakfast.   Vitamin D3 2000 units capsule Take 2,000 Units by mouth daily.       Review of Systems  Unable to perform ROS: Dementia  Constitutional: Negative for activity change, appetite change, chills, diaphoresis, fatigue, fever and unexpected weight change.  HENT: Negative.   Respiratory: Negative.   Cardiovascular: Negative.   Gastrointestinal: Negative.   Genitourinary: Negative.   Musculoskeletal: Positive for arthralgias and myalgias.  Skin: Positive for rash.  Neurological: Negative.   Psychiatric/Behavioral: Negative.   All other systems reviewed and are negative.   Immunization History  Administered Date(s) Administered  . Influenza-Unspecified 01/31/2017  . PPD Test 06/13/2016   Pertinent  Health Maintenance Due  Topic Date Due  . DEXA SCAN  12/21/1995  . PNA vac Low Risk Adult (1 of 2 - PCV13) 12/21/1995  . INFLUENZA VACCINE  Completed   No flowsheet data found. Functional Status  Survey:    Vitals:   04/17/17 1657  BP: 123/89  Pulse: 60  Resp: 18  Temp: 97.7 F (36.5 C)  TempSrc: Oral  SpO2: 100%  Weight: 143 lb 11.2 oz (65.2 kg)  Height: 5' 4.2" (1.631 m)   Body mass index is 24.51 kg/m. Physical Exam  Constitutional: She is oriented to person, place, and time. Vital signs are normal. She appears well-developed and well-nourished. She is active and cooperative. She does not appear ill. No distress.  HENT:  Head: Normocephalic and atraumatic.  Mouth/Throat: Uvula is midline, oropharynx is clear and moist and mucous membranes are normal. Mucous membranes are not pale, not dry and not cyanotic.  Eyes: Conjunctivae, EOM  and lids are normal. Pupils are equal, round, and reactive to light.  Neck: Trachea normal, normal range of motion and full passive range of motion without pain. Neck supple. No JVD present. No tracheal deviation, no edema and no erythema present. No thyromegaly present.  Cardiovascular: Normal rate, regular rhythm, normal heart sounds, intact distal pulses and normal pulses. Exam reveals no gallop, no distant heart sounds and no friction rub.  No murmur heard. Pulses:      Dorsalis pedis pulses are 2+ on the right side, and 2+ on the left side.  No edema  Pulmonary/Chest: Effort normal and breath sounds normal. No accessory muscle usage. No respiratory distress. She has no decreased breath sounds. She has no wheezes. She has no rhonchi. She has no rales. She exhibits no tenderness.  Abdominal: Soft. Normal appearance and bowel sounds are normal. She exhibits no distension and no ascites. There is no tenderness.  Musculoskeletal: Normal range of motion. She exhibits no edema or tenderness.  Expected osteoarthritis, stiffness; Bilateral Calves soft, supple. Negative Homan's Sign. B- pedal pulses equal  Neurological: She is alert and oriented to person, place, and time. She has normal strength.  Skin: Skin is warm, dry and intact. Rash noted. Rash  is pustular and vesicular. She is not diaphoretic. There is erythema. No cyanosis. No pallor. Nails show no clubbing.     Psychiatric: Her speech is normal. Thought content normal. Her mood appears anxious. Her affect is labile. She is withdrawn. Cognition and memory are impaired. She expresses impulsivity and inappropriate judgment. She exhibits abnormal recent memory.  Nursing note and vitals reviewed.   Labs reviewed: Recent Labs    09/10/16 0550 03/07/17 1253 03/11/17 0800  NA 138 138 141  K 4.2 4.2 4.0  CL 99* 101 103  CO2 29 28 29   GLUCOSE 82 79 79  BUN 26* 21* 22*  CREATININE 1.40* 1.16* 1.18*  CALCIUM 9.2 9.1 8.7*  MG 2.4  --  2.3   Recent Labs    09/10/16 0550 03/07/17 1253 03/11/17 0800  AST 40 27 23  ALT 28 19 16   ALKPHOS 139* 77 84  BILITOT 0.7 0.5 0.4  PROT 7.2 6.7 6.1*  ALBUMIN 3.5 3.6 3.2*   Recent Labs    09/10/16 0550 03/07/17 1253 03/11/17 0800  WBC 8.2 6.9 5.1  NEUTROABS 4.6 3.8 2.1  HGB 13.7 14.0 13.1  HCT 40.7 41.7 39.0  MCV 94.8 96.1 96.3  PLT 304 196 165   Lab Results  Component Value Date   TSH 0.124 (L) 03/11/2017   Lab Results  Component Value Date   HGBA1C 6.9 (H) 02/09/2014   Lab Results  Component Value Date   CHOL 171 09/10/2016   HDL 54 09/10/2016   LDLCALC 72 09/10/2016   TRIG 223 (H) 09/10/2016   CHOLHDL 3.2 09/10/2016    Significant Diagnostic Results in last 30 days:  No results found.  Assessment/Plan Herpes zoster without complication  Valacyclovir 1 g 3 times daily times 7 days  Continue tramadol 50 mg p.o. 3 times daily scheduled  Continue tramadol 50 mg p.o. every 6 hours as needed breakthrough pain  Continue Tylenol 650 mg p.o. 4 times daily scheduled  Keep rash clean and dry  Do not allow pregnant staff members to care for patient  Family/ staff Communication:   Total Time:  Documentation:  Face to Face:  Family/Phone:   Labs/tests ordered:    Medication list reviewed and assessed  for continued appropriateness.  Vikki Ports, NP-C Geriatrics [facility] [phone] [place], [place] [address] Cell Phone (Mon-Fri 8am-5pm):  [phone] On Call:  [phone] & follow prompts after 5pm & weekends Office Phone:  [phone] Office Fax:  [phone]

## 2017-05-01 ENCOUNTER — Encounter: Payer: Self-pay | Admitting: Gerontology

## 2017-05-01 ENCOUNTER — Non-Acute Institutional Stay (SKILLED_NURSING_FACILITY): Payer: Medicare Other | Admitting: Gerontology

## 2017-05-01 DIAGNOSIS — I4891 Unspecified atrial fibrillation: Secondary | ICD-10-CM

## 2017-05-01 DIAGNOSIS — I10 Essential (primary) hypertension: Secondary | ICD-10-CM

## 2017-05-01 DIAGNOSIS — E039 Hypothyroidism, unspecified: Secondary | ICD-10-CM

## 2017-05-06 ENCOUNTER — Encounter
Admission: RE | Admit: 2017-05-06 | Discharge: 2017-05-06 | Disposition: A | Discharge status: Home / Self Care | Payer: Medicare Other | Source: Ambulatory Visit | Attending: Internal Medicine | Admitting: Internal Medicine

## 2017-05-22 NOTE — Assessment & Plan Note (Signed)
Variable. Patient continues to have intermittent episodes of crying.  Patient symptoms become more withdrawn and less sociable lately.  Patient continues on venlafaxine for depression and Depakote for mood stabilization.

## 2017-05-22 NOTE — Assessment & Plan Note (Addendum)
Variable.  Patient seems to be more relaxed when in the commons area with other residents.  However, patient continues to have intermittent periods of uncontrollable crying and whining.  Patient unable to verbalize what is wrong or why she is crying.  She does appear to have a fearful expression on her face.  Patient is inconsolable at this time.  Patient continues on Seroquel 50 mg p.o. twice daily, Buspirone 10 mg p.o. twice daily, and Xanax 0.25 mg p.o. every 8 hours as needed.

## 2017-05-22 NOTE — Assessment & Plan Note (Addendum)
Stable.  Patient remains on amiodarone 200 mg p.o. Daily as well as aspirin 81 mg chewable tablet daily.  Pulse consistently remains less than 100 bpm.  Consistently regular.  Patient denies chest pain or shortness of breath.

## 2017-05-22 NOTE — Progress Notes (Signed)
Location:    Nursing Home Room Number: 353I Place of Service:  SNF (31) Provider:  Toni Arthurs, NP-C  Toni Arthurs, NP  Patient Care Team: Toni Arthurs, NP as PCP - General (Family Medicine)  Extended Emergency Contact Information Primary Emergency Contact: Tillie Rung of [place] Phone: [phone] Mobile Phone: [phone] Relation: Other Secondary Emergency Contact: Buchanan,Brad Address: [address]          [place], [place] [address] [place] Phone: [phone] Work Phone: [phone] Relation: Other  Code Status: DNR Goals of care: Advanced Directive information Advanced Directives 05/01/2017  Does Patient Have a Medical Advance Directive? Yes  Type of Advance Directive Out of facility DNR (pink MOST or yellow form)  Does patient want to make changes to medical advance directive? No - Patient declined     Chief Complaint  Patient presents with  . Medical Management of Chronic Issues    Routine Visit    HPI:  Pt is a 84 y.o. female seen today for medical management of chronic diseases.    Hypothyroidism Stable.  Patient continues on Synthroid 137 mcg p.o. daily.  No issues with this.  Atrial fibrillation with tachycardic ventricular rate (HCC) Stable.  Patient remains on amiodarone 200 mg p.o. Daily as well as aspirin 81 mg chewable tablet daily.  Pulse consistently remains less than 100 bpm.  Consistently regular.  Patient denies chest pain or shortness of breath.  Essential (primary) hypertension Stable.  No issues with hypotension.  Patient remains on lisinopril 5 mg p.o. daily as well as Lasix 40 mg p.o. daily.  Patient continues on hospice services.  Patient continues to have slow decline.  Please note patient with dementia and very limited verbal abilities.  Unable to get complete ROS.   Past Medical History:  Diagnosis Date  . Hyperlipidemia, unspecified   . Stroke [facility])    with partial right  hemiparesis  . Thyroid disease    Past Surgical History:  Procedure Laterality Date  . APPENDECTOMY      Allergies  Allergen Reactions  . Penicillin G     Other reaction(s): Unknown  . Penicillins     Allergies as of 05/01/2017      Reactions   Penicillin G    Other reaction(s): Unknown   Penicillins       Medication List        Accurate as of 05/01/17 11:59 PM. Always use your most recent med list.          acetaminophen 325 MG tablet Commonly known as:  TYLENOL Take 650 mg by mouth 4 (four) times daily.   ALPRAZolam 0.5 MG tablet Commonly known as:  XANAX Take 0.5 mg by mouth every 4 (four) hours as needed for anxiety.   alum & mag hydroxide-simeth 193-790-24 MG/5ML suspension Commonly known as:  MAALOX PLUS Take 30 mLs by mouth every 4 (four) hours as needed for indigestion.   amiodarone 200 MG tablet Commonly known as:  PACERONE Take 200 mg by mouth daily.   aspirin 81 MG chewable tablet Chew 81 mg by mouth daily.   busPIRone 10 MG tablet Commonly known as:  BUSPAR Take 10 mg by mouth 2 (two) times daily.   CERTAVITE/ANTIOXIDANTS Tabs Take 1 tablet by mouth daily.   chlorhexidine 0.12 % solution Commonly known as:  PERIDEX Use as directed 15 mLs in the mouth or throat at bedtime. Rinse and spit out at bedtime after brushing  cyanocobalamin 1000 MCG tablet Take 1,000 mcg by mouth daily.   DERMACLOUD Crea Apply liberal amount topically  to area of skin irritation as needed. Ok to leave at bedside.   divalproex 125 MG capsule Commonly known as:  DEPAKOTE SPRINKLE Take 250 mg by mouth 2 (two) times daily.   docusate sodium 100 MG capsule Commonly known as:  COLACE Take 100 mg by mouth 2 (two) times daily.   furosemide 40 MG tablet Commonly known as:  LASIX Take 40 mg by mouth daily.   ipratropium-albuterol 0.5-2.5 (3) MG/3ML Soln Commonly known as:  DUONEB Take 3 mLs by nebulization 2 (two) times daily.   levothyroxine 137 MCG  tablet Commonly known as:  SYNTHROID, LEVOTHROID Take 137 mcg by mouth daily before breakfast.   lisinopril 5 MG tablet Commonly known as:  PRINIVIL,ZESTRIL Take 5 mg by mouth daily.   lovastatin 20 MG tablet Commonly known as:  MEVACOR Take 20 mg by mouth at bedtime.   ondansetron 8 MG tablet Commonly known as:  ZOFRAN Take 8 mg by mouth 2 (two) times daily as needed for nausea or vomiting.   OXYGEN Inhale 3 L into the lungs continuous.   polyethylene glycol packet Commonly known as:  MIRALAX / GLYCOLAX Take 17 g by mouth See admin instructions. On Mon, Wed, Frid for constipation prevention   potassium chloride SA 20 MEQ tablet Commonly known as:  K-DUR,KLOR-CON Take 20 mEq by mouth daily.   pyrithione zinc 1 % shampoo Commonly known as:  HEAD AND SHOULDERS Wash scalp once a day on Tuesday and Friday   QUEtiapine 50 MG tablet Commonly known as:  SEROQUEL Take 50 mg by mouth every 12 (twelve) hours.   traMADol 50 MG tablet Commonly known as:  ULTRAM Take 1 tablet (50 mg total) by mouth every 6 (six) hours as needed. Ok to hold for sedation   traMADol 50 MG tablet Commonly known as:  ULTRAM Take 1 tablet (50 mg total) by mouth 3 (three) times daily.   travoprost (benzalkonium) 0.004 % ophthalmic solution Commonly known as:  TRAVATAN Place 1 drop into both eyes at bedtime.   Venlafaxine HCl 225 MG Tb24 Take 1 tablet by mouth daily.   Vitamin D3 2000 units capsule Take 2,000 Units by mouth daily.       Review of Systems  Unable to perform ROS: Dementia  Constitutional: Negative for activity change, appetite change, chills, diaphoresis and fever.  HENT: Negative for congestion, mouth sores, nosebleeds, postnasal drip, sneezing, sore throat, trouble swallowing and voice change.   Respiratory: Negative for apnea, cough, choking, chest tightness, shortness of breath and wheezing.   Cardiovascular: Negative for chest pain, palpitations and leg swelling.   Gastrointestinal: Negative for abdominal distention, abdominal pain, constipation, diarrhea and nausea.  Genitourinary: Negative for difficulty urinating, dysuria, frequency and urgency.  Musculoskeletal: Positive for arthralgias (typical arthritis). Negative for back pain, gait problem and myalgias.  Skin: Negative for color change, pallor, rash and wound.  Neurological: Negative for dizziness, tremors, syncope, speech difficulty, weakness, numbness and headaches.  Psychiatric/Behavioral: Positive for agitation, confusion and dysphoric mood. Negative for behavioral problems.  All other systems reviewed and are negative.   Immunization History  Administered Date(s) Administered  . Influenza-Unspecified 01/31/2017  . PPD Test 06/13/2016   Pertinent  Health Maintenance Due  Topic Date Due  . DEXA SCAN  12/21/1995  . PNA vac Low Risk Adult (1 of 2 - PCV13) 12/21/1995  . INFLUENZA VACCINE  Completed  No flowsheet data found. Functional Status Survey:    Vitals:   05/01/17 1235  BP: 118/60  Pulse: 66  Resp: 18  Temp: 97.6 F (36.4 C)  TempSrc: Oral  SpO2: 100%  Weight: 143 lb 11.2 oz (65.2 kg)  Height: 5' 6"  (1.676 m)   Body mass index is 23.19 kg/m. Physical Exam  Constitutional: She is oriented to person, place, and time. Vital signs are normal. She appears well-developed and well-nourished. She is active and cooperative. She does not appear ill. No distress.  HENT:  Head: Normocephalic and atraumatic.  Mouth/Throat: Uvula is midline, oropharynx is clear and moist and mucous membranes are normal. Mucous membranes are not pale, not dry and not cyanotic.  Eyes: Conjunctivae, EOM and lids are normal. Pupils are equal, round, and reactive to light.  Neck: Trachea normal, normal range of motion and full passive range of motion without pain. Neck supple. No JVD present. No tracheal deviation, no edema and no erythema present. No thyromegaly present.  Cardiovascular: Normal  rate, regular rhythm, normal heart sounds, intact distal pulses and normal pulses. Exam reveals no gallop, no distant heart sounds and no friction rub.  No murmur heard. Pulses:      Dorsalis pedis pulses are 2+ on the right side, and 2+ on the left side.  No edema  Pulmonary/Chest: Effort normal and breath sounds normal. No accessory muscle usage. No respiratory distress. She has no decreased breath sounds. She has no wheezes. She has no rhonchi. She has no rales. She exhibits no tenderness.  Abdominal: Soft. Normal appearance and bowel sounds are normal. She exhibits no distension and no ascites. There is no tenderness.  Musculoskeletal: Normal range of motion. She exhibits no edema or tenderness.  Expected osteoarthritis, stiffness; Bilateral Calves soft, supple. Negative Homan's Sign. B- pedal pulses equal; generalized weakness  Neurological: She is alert and oriented to person, place, and time. She has normal strength.  Skin: Skin is warm, dry and intact. She is not diaphoretic. No cyanosis. No pallor. Nails show no clubbing.  Psychiatric: Her speech is normal. Thought content normal. Her mood appears anxious. Her affect is labile. She is agitated and withdrawn. Cognition and memory are impaired. She expresses impulsivity and inappropriate judgment. She exhibits abnormal recent memory and abnormal remote memory.  Nursing note and vitals reviewed.   Labs reviewed: Recent Labs    09/10/16 0550 03/07/17 1253 03/11/17 0800  NA 138 138 141  K 4.2 4.2 4.0  CL 99* 101 103  CO2 29 28 29   GLUCOSE 82 79 79  BUN 26* 21* 22*  CREATININE 1.40* 1.16* 1.18*  CALCIUM 9.2 9.1 8.7*  MG 2.4  --  2.3   Recent Labs    09/10/16 0550 03/07/17 1253 03/11/17 0800  AST 40 27 23  ALT 28 19 16   ALKPHOS 139* 77 84  BILITOT 0.7 0.5 0.4  PROT 7.2 6.7 6.1*  ALBUMIN 3.5 3.6 3.2*   Recent Labs    09/10/16 0550 03/07/17 1253 03/11/17 0800  WBC 8.2 6.9 5.1  NEUTROABS 4.6 3.8 2.1  HGB 13.7 14.0  13.1  HCT 40.7 41.7 39.0  MCV 94.8 96.1 96.3  PLT 304 196 165   Lab Results  Component Value Date   TSH 0.124 (L) 03/11/2017   Lab Results  Component Value Date   HGBA1C 6.9 (H) 02/09/2014   Lab Results  Component Value Date   CHOL 171 09/10/2016   HDL 54 09/10/2016   LDLCALC 72 09/10/2016  TRIG 223 (H) 09/10/2016   CHOLHDL 3.2 09/10/2016    Significant Diagnostic Results in last 30 days:  No results found.  Assessment/Plan Kristal was seen today for medical management of chronic issues.  Diagnoses and all orders for this visit:  Essential (primary) hypertension  Stable  Continue current medication regimen  Follow-up with cardiologist as needed  Met B in 6 months  Atrial fibrillation with tachycardic ventricular rate (HCC)  Stable  Continue current medication regimen  Follow-up with cardiologist as needed  Hypothyroidism, unspecified type  Stable  Continue current medication regimen  Recheck TSH in 6 months   Family/ staff Communication:   Total Time:  Documentation:  Face to Face:  Family/Phone:   Labs/tests ordered: Not due  Medication list reviewed and assessed for continued appropriateness. Monthly medication orders reviewed and signed.  Vikki Ports, NP-C Geriatrics [facility] [phone], [place] [address] Cell Phone (Mon-Fri 8am-5pm):  [phone] On Call:  [phone] & follow prompts after 5pm & weekends Office Phone:  [phone] Office Fax:  [phone]

## 2017-05-22 NOTE — Assessment & Plan Note (Signed)
Stable.  Patient continues on Travatan Z drops in both eyes at bedtime.  No complaints of pain or redness.

## 2017-05-22 NOTE — Assessment & Plan Note (Signed)
Stable.  Patient continues on Synthroid 137 mcg p.o. daily.  No issues with this.

## 2017-05-22 NOTE — Progress Notes (Signed)
Location:    Nursing Home Room Number: 741U Place of Service:  SNF (31) Provider:  Toni Arthurs, NP-C  Lori Arthurs, NP  Patient Care Team: Lori Arthurs, NP as PCP - General (Family Medicine)  Extended Emergency Contact Information Primary Emergency Contact: Lori Elliott of [place] Phone: [phone] Mobile Phone: [phone] Relation: Other Secondary Emergency Contact: Lori Elliott Address: [address]          [place], [place] [address] [place] Phone: [phone] Work Phone: [phone] Relation: Other  Code Status: DNR Goals of care: Advanced Directive information Advanced Directives 05/01/2017  Does Patient Have a Medical Advance Directive? Yes  Type of Advance Directive Out of facility DNR (pink MOST or yellow form)  Does patient want to make changes to medical advance directive? No - Patient declined     Chief Complaint  Patient presents with  . Medical Management of Chronic Issues    Routine Visit    HPI:  Pt is a 84 y.o. female seen today for medical management of chronic diseases.    Anxiety disorder Variable.  Patient seems to be more relaxed when in the [place] area with other residents.  However, patient continues to have intermittent periods of uncontrollable crying and whining.  Patient unable to verbalize what is wrong or why she is crying.  She does appear to have a fearful expression on her face.  Patient is inconsolable at this time.  Unspecified glaucoma Stable.  Patient continues on Travatan Z drops in both eyes at bedtime.  No complaints of pain or redness.  Major depression, single episode Variable. Patient continues to have intermittent episodes of crying.  Patient symptoms become more withdrawn and less sociable lately.  Patient continues on venlafaxine for depression and Depakote for mood stabilization.  Patient remains on hospice services.  She shows a very slow decline.  More frequent  crying/tearful episodes as of late.  Past Medical History:  Diagnosis Date  . Hyperlipidemia, unspecified   . Stroke [facility])    with partial right hemiparesis  . Thyroid disease    Past Surgical History:  Procedure Laterality Date  . APPENDECTOMY      Allergies  Allergen Reactions  . Penicillin G     Other reaction(s): Unknown  . Penicillins     Allergies as of 04/03/2017      Reactions   Penicillin G    Other reaction(s): Unknown   Penicillins       Medication List        Accurate as of 04/03/17 11:59 PM. Always use your most recent med list.          acetaminophen 325 MG tablet Commonly known as:  TYLENOL Take 650 mg by mouth 4 (four) times daily.   ALPRAZolam 0.25 MG tablet Commonly known as:  XANAX Take 0.25 mg by mouth every 8 (eight) hours as needed for anxiety.   alum & mag hydroxide-simeth 917-915-05 MG/5ML suspension Commonly known as:  MAALOX PLUS Take 30 mLs by mouth every 4 (four) hours as needed for indigestion.   amiodarone 200 MG tablet Commonly known as:  PACERONE Take 200 mg by mouth daily.   aspirin 81 MG chewable tablet Chew 81 mg by mouth daily.   busPIRone 10 MG tablet Commonly known as:  BUSPAR Take 10 mg by mouth 2 (two) times daily.   CERTAVITE/ANTIOXIDANTS Tabs Take 1 tablet by mouth daily.   chlorhexidine 0.12 % solution Commonly known as:  PERIDEX  Use as directed 15 mLs in the mouth or throat at bedtime. Rinse and spit out at bedtime after brushing   cyanocobalamin 1000 MCG tablet Take 1,000 mcg by mouth daily.   DERMACLOUD Crea Apply liberal amount topically  to area of skin irritation as needed. Ok to leave at bedside.   divalproex 125 MG capsule Commonly known as:  DEPAKOTE SPRINKLE Take 250 mg by mouth 2 (two) times daily.   docusate sodium 100 MG capsule Commonly known as:  COLACE Take 100 mg by mouth 2 (two) times daily.   furosemide 40 MG tablet Commonly known as:  LASIX Take 40 mg by mouth daily.     ipratropium-albuterol 0.5-2.5 (3) MG/3ML Soln Commonly known as:  DUONEB Take 3 mLs by nebulization 2 (two) times daily.   levothyroxine 137 MCG tablet Commonly known as:  SYNTHROID, LEVOTHROID Take 137 mcg by mouth daily before breakfast.   lisinopril 5 MG tablet Commonly known as:  PRINIVIL,ZESTRIL Take 5 mg by mouth daily.   lovastatin 20 MG tablet Commonly known as:  MEVACOR Take 20 mg by mouth at bedtime.   ondansetron 8 MG tablet Commonly known as:  ZOFRAN Take 8 mg by mouth 2 (two) times daily as needed for nausea or vomiting.   OXYGEN Inhale 3 L into the lungs continuous.   polyethylene glycol packet Commonly known as:  MIRALAX / GLYCOLAX Take 17 g by mouth See admin instructions. On Mon, Wed, Frid for constipation prevention   potassium chloride SA 20 MEQ tablet Commonly known as:  K-DUR,KLOR-CON Take 20 mEq by mouth daily.   pyrithione zinc 1 % shampoo Commonly known as:  HEAD AND SHOULDERS Wash scalp once a day on Tuesday and Friday   QUEtiapine 50 MG tablet Commonly known as:  SEROQUEL Take 50 mg by mouth every 12 (twelve) hours.   traMADol 50 MG tablet Commonly known as:  ULTRAM Take 1 tablet (50 mg total) by mouth every 6 (six) hours as needed. Ok to hold for sedation   traMADol 50 MG tablet Commonly known as:  ULTRAM Take 1 tablet (50 mg total) by mouth 3 (three) times daily.   travoprost (benzalkonium) 0.004 % ophthalmic solution Commonly known as:  TRAVATAN Place 1 drop into both eyes at bedtime.   venlafaxine XR 150 MG 24 hr capsule Commonly known as:  EFFEXOR-XR Take 150 mg by mouth daily with breakfast.   Vitamin D3 2000 units capsule Take 2,000 Units by mouth daily.       Review of Systems  Unable to perform ROS: Dementia  Constitutional: Negative for activity change, appetite change, chills, diaphoresis and fever.  HENT: Negative for congestion, mouth sores, nosebleeds, postnasal drip, sneezing, sore throat, trouble swallowing  and voice change.   Respiratory: Negative for apnea, cough, choking, chest tightness, shortness of breath and wheezing.   Cardiovascular: Negative for chest pain, palpitations and leg swelling.  Gastrointestinal: Negative for abdominal distention, abdominal pain, constipation, diarrhea and nausea.  Genitourinary: Negative for difficulty urinating, dysuria, frequency and urgency.  Musculoskeletal: Negative for back pain, gait problem and myalgias. Arthralgias: typical arthritis.  Skin: Negative for color change, pallor, rash and wound.  Neurological: Negative for dizziness, tremors, syncope, speech difficulty, weakness, numbness and headaches.  Psychiatric/Behavioral: Positive for agitation, confusion, decreased concentration and dysphoric mood. Negative for behavioral problems.  All other systems reviewed and are negative.   Immunization History  Administered Date(s) Administered  . Influenza-Unspecified 01/31/2017  . PPD Test 06/13/2016   Pertinent  Health Maintenance  Due  Topic Date Due  . DEXA SCAN  12/21/1995  . PNA vac Low Risk Adult (1 of 2 - PCV13) 12/21/1995  . INFLUENZA VACCINE  Completed   No flowsheet data found. Functional Status Survey:    Vitals:   04/03/17 1301  BP: 115/87  Pulse: (!) 56  Resp: 18  Temp: 97.6 F (36.4 C)  TempSrc: Oral  SpO2: 99%  Weight: 138 lb 11.2 oz (62.9 kg)  Height: 5' 4.2" (1.631 m)   Body mass index is 23.66 kg/m. Physical Exam  Constitutional: She is oriented to person, place, and time. Vital signs are normal. She appears well-developed and well-nourished. She is active and cooperative. She does not appear ill. No distress.  HENT:  Head: Normocephalic and atraumatic.  Mouth/Throat: Uvula is midline, oropharynx is clear and moist and mucous membranes are normal. Mucous membranes are not pale, not dry and not cyanotic.  Eyes: Conjunctivae, EOM and lids are normal. Pupils are equal, round, and reactive to light.  Neck: Trachea  normal, normal range of motion and full passive range of motion without pain. Neck supple. No JVD present. No tracheal deviation, no edema and no erythema present. No thyromegaly present.  Cardiovascular: Normal rate, regular rhythm, normal heart sounds, intact distal pulses and normal pulses. Exam reveals no gallop, no distant heart sounds and no friction rub.  No murmur heard. Pulses:      Dorsalis pedis pulses are 2+ on the right side, and 2+ on the left side.  No edema  Pulmonary/Chest: Effort normal and breath sounds normal. No accessory muscle usage. No respiratory distress. She has no decreased breath sounds. She has no wheezes. She has no rhonchi. She has no rales. She exhibits no tenderness.  Abdominal: Soft. Normal appearance and bowel sounds are normal. She exhibits no distension and no ascites. There is no tenderness.  Musculoskeletal: Normal range of motion. She exhibits no edema or tenderness.  Expected osteoarthritis, stiffness; Bilateral Calves soft, supple. Negative Homan's Sign. B- pedal pulses equal  Neurological: She is alert and oriented to person, place, and time. She has normal strength.  Skin: Skin is warm, dry and intact. She is not diaphoretic. No cyanosis. No pallor. Nails show no clubbing.  Psychiatric: Thought content normal. Her mood appears anxious. Her affect is labile. She is agitated and withdrawn. Cognition and memory are impaired. She expresses impulsivity and inappropriate judgment. She exhibits a depressed mood. She is noncommunicative. She exhibits abnormal recent memory and abnormal remote memory.  Nursing note and vitals reviewed.   Labs reviewed: Recent Labs    09/10/16 0550 03/07/17 1253 03/11/17 0800  NA 138 138 141  K 4.2 4.2 4.0  CL 99* 101 103  CO2 _0 GLUCOSE 82 79 79  BUN 26* 21* 22*  CREATININE 1.40* 1.16* 1.18*  CALCIUM 9.2 9.1 8.7*  MG 2.4  --  2.3   Recent Labs    09/10/16 0550 03/07/17 1253 03/11/17 0800  AST 40 27 23   ALT _1 ALKPHOS 139* 77 84  BILITOT 0.7 0.5 0.4  PROT 7.2 6.7 6.1*  ALBUMIN 3.5 3.6 3.2*   Recent Labs    09/10/16 0550 03/07/17 1253 03/11/17 0800  WBC 8.2 6.9 5.1  NEUTROABS 4.6 3.8 2.1  HGB 13.7 14.0 13.1  HCT 40.7 41.7 39.0  MCV 94.8 96.1 96.3  PLT 304 196 165   Lab Results  Component Value Date   TSH 0.124 (L) 03/11/2017   Lab  Results  Component Value Date   HGBA1C 6.9 (H) 02/09/2014   Lab Results  Component Value Date   CHOL 171 09/10/2016   HDL 54 09/10/2016   LDLCALC 72 09/10/2016   TRIG 223 (H) 09/10/2016   CHOLHDL 3.2 09/10/2016    Significant Diagnostic Results in last 30 days:  No results found.  Assessment/Plan Lori Elliott was seen today for medical management of chronic issues.  Diagnoses and all orders for this visit:  Anxiety disorder, unspecified type  Unstable  Continue current medication regimen  Consult with hospice about coverage for Nuedexta for possible PBA  Major depressive disorder with single episode, remission status unspecified  UnStable  Continue current medication regimen with noted exception  Increase Depakote sprinkles 125 mg capsules- 2 capsules p.o. twice daily  Glaucoma of both eyes, unspecified glaucoma type  Stable  Continue current medication regimen  Follow-up with ophthalmologist as needed   Family/ staff Communication:   Total Time:  Documentation:  Face to Face:  Family/Phone:   Labs/tests ordered: Labs previously ordered.  CBC, met C, vitamin D, vitamin B12, magnesium, TSH reviewed.  All stable  Medication list reviewed and assessed for continued appropriateness. Monthly medication orders reviewed and signed.  Vikki Ports, NP-C Geriatrics [facility] [phone], [place] [address] Cell Phone (Mon-Fri 8am-5pm):  [phone] On Call:  [phone] & follow prompts after 5pm & weekends Office Phone:  [phone] Office Fax:   [phone]

## 2017-05-22 NOTE — Assessment & Plan Note (Signed)
Stable.  No issues with hypotension.  Patient remains on lisinopril 5 mg p.o. daily as well as Lasix 40 mg p.o. daily.

## 2017-06-06 DEATH — deceased
